# Patient Record
Sex: Female | Born: 1965 | Race: White | Hispanic: No | Marital: Married | Smoking: Never smoker
Health system: Southern US, Community
[De-identification: ages and names within clinical notes are randomized; demographics above are authoritative.]

## PROBLEM LIST (undated history)

## (undated) DIAGNOSIS — N750 Cyst of Bartholin's gland: Secondary | ICD-10-CM

## (undated) DIAGNOSIS — I10 Essential (primary) hypertension: Secondary | ICD-10-CM

## (undated) DIAGNOSIS — K5792 Diverticulitis of intestine, part unspecified, without perforation or abscess without bleeding: Secondary | ICD-10-CM

## (undated) DIAGNOSIS — N809 Endometriosis, unspecified: Secondary | ICD-10-CM

## (undated) HISTORY — PX: APPENDECTOMY: SHX54

## (undated) HISTORY — PX: LITHOTRIPSY: SUR834

## (undated) HISTORY — PX: FOOT SURGERY: SHX648

## (undated) HISTORY — PX: ABDOMINAL HYSTERECTOMY: SHX81

---

## 2008-01-14 ENCOUNTER — Encounter
Admission: RE | Admit: 2008-01-14 | Discharge: 2008-04-07 | Payer: Self-pay | Admitting: Physical Medicine & Rehabilitation

## 2008-01-16 ENCOUNTER — Ambulatory Visit: Payer: Self-pay | Admitting: Physical Medicine & Rehabilitation

## 2008-02-09 ENCOUNTER — Ambulatory Visit: Payer: Self-pay | Admitting: Physical Medicine & Rehabilitation

## 2008-03-11 ENCOUNTER — Ambulatory Visit: Payer: Self-pay | Admitting: Physical Medicine & Rehabilitation

## 2008-04-07 ENCOUNTER — Encounter
Admission: RE | Admit: 2008-04-07 | Discharge: 2008-05-03 | Payer: Self-pay | Admitting: Physical Medicine & Rehabilitation

## 2008-04-08 ENCOUNTER — Ambulatory Visit: Payer: Self-pay | Admitting: Physical Medicine & Rehabilitation

## 2008-05-03 ENCOUNTER — Ambulatory Visit: Payer: Self-pay | Admitting: Physical Medicine & Rehabilitation

## 2010-12-12 NOTE — Procedures (Signed)
[name], [name]          ACCOUNT NO.:  [id number]   MEDICAL RECORD NO.:  [id number]          PATIENT TYPE:  REC   LOCATION:  [facility]                          FACILITY:  [facility]   PHYSICIAN:  Erick Colace, M.D.DATE OF BIRTH:  [birth year]   DATE OF PROCEDURE:  04/08/2008  DATE OF DISCHARGE:                               OPERATIVE REPORT   PROCEDURE:  Right L5 dorsal ramus radiofrequency neurotomy, right L4  medial branch radiofrequency neurotomy, right L3 medial branch  radiofrequency neurotomy under sacral and lumbar fluoroscopic imaging.   INDICATION:  Lumbosacral spondylosis without myelopathy which had been  temporarily relieved by medial branch blocks at corresponding levels.  She has had 80% relief of pain on one occasion and 100% on another  occasion.   Pain interferes with mobility including bending and standing and only  partial response to medication management.  She has tried other  conservative care and she elects to proceed with the procedure.   Informed consent was obtained after describing risks and benefits of the  procedure with the patient.  These include bleeding, bruising,  infection, temporary or permanent paralysis.  She elects to proceed and  has given written consent.  The patient placed prone on the fluoroscopy  table. Betadine prep, sterilely draped. A 25-gauge 1-1/2-inch needle was  used to anesthetize the skin and subcu tissue, 1% lidocaine x2 cc, then  a 20-gauge 10-cm RF needle with 10 mm curved active tip was inserted  under fluoroscopic guidance first starting in the right S1, SAP, and  sacroiliac junction.  Bone contact made, confirmed with lateral imaging.  Sensory stem at 50 Hz followed by motor stem at 2 Hz confirmed proper  needle location followed by injection of 1 mL of solution containing 1  mL of 4 mg/mL dexamethasone and 2 mL of 1% MPF lidocaine followed by  radiofrequency lesioning at 70 degrees Celsius for 70 seconds.  In the  right L5, SAP transverse process junction was targeted.  Bone contact  made and confirmed with lateral imaging.  Sensory stem at 50 Hz followed  by motor stem at 2 Hz confirmed proper needle location followed by  injection of 1 mL of dexamethasone-lidocaine solution and radiofrequency  lesioning at 70 degrees Celsius for 70 seconds.  In the right L4, SAP  transverse process junction was targeted.  Bone contact made and  confirmed with lateral imaging.  Sensory stem at 50 Hz followed by motor  stem at 2 Hz confirmed proper needle location followed by injection of 1  mL of dexamethasone-lidocaine solution and radiofrequency lesioning at  70 degrees Celsius for 70 seconds.  The patient tolerated the procedure  well.  Pre and post injection vitals were stable.  Post injection  instructions given.      Erick Colace, M.D.  Electronically Signed     AEK/MEDQ  D:  04/08/2008 09:58:34  T:  04/08/2008 23:02:54  Job:  [id number]

## 2010-12-12 NOTE — Assessment & Plan Note (Signed)
CHIEF COMPLAINT:  Mid back pain.   The patient is here for radiofrequency neurotomy; however, she has no  new complaint that she would like to me to address today as well.  She  has pain in between the shoulder blades.  Has not had any trauma.  No  work injury.  She thinks that the type of work she is doing makes her a  little bit more prone to this, standing in one position for a long  period of time.  She has no lower extremity or upper extremity weakness  or numbness.  Her neck is sore, but this does not seem to be shooting  pain into the interscapular region.   REVIEW OF SYSTEMS:  Positive for depression, anxiety, and abdominal  pain.  She has had no fevers, chills, weight loss, or other signs of  infection.   SOCIAL HISTORY:  Lives with her fiance.  Continues to work 40 hours a  week.   FAMILY HISTORY:  Diabetes, high blood pressure, and alcohol abuse.   CURRENT MEDICATIONS:  Tramadol, went down to 120 tablets last month down  from 180.  She actually ran out a bit early.  I feel that she needs to  take at least what she used to.   PHYSICAL EXAMINATION:  GENERAL:  She has tenderness to palpation in her  bilateral thoracic paraspinals.  She has no weakness in the upper  extremities.  She has good neck range of motion; however, she does have  some pain with extension.   Gait is normal.   IMPRESSION:  Thoracic interscapular pain, likely myofascial, postural.  She may have some cervical facet-radiating pain in that area as well.   PLAN:  1. I will go ahead and send her to physical therapy, 4 visits in      [place] at [facility], to work on posture, interscapular      strengthening, and scapular stabilization.  2. I will see her back in about a month to follow up on this.  3. We will go back up to 180 tablets of Tramadol per month.  We may      reduce it down to 150 next month should her symptomatology improve.      If it does not improve, consider cervical imaging  with possible      medial branch blocks.      Sheila [name], M.D.  Electronically Signed     AEK/MedQ  D:  05/03/2008 10:33:48  T:  05/03/2008 23:17:37  Job #:  [id number]   cc:   Sheila [name]  Fax: [phone]

## 2010-12-12 NOTE — Procedures (Signed)
NAME:  Sheila [name], Sheila [name]          ACCOUNT NO.:  [id number]   MEDICAL RECORD NO.:  [id number]          PATIENT TYPE:  REC   LOCATION:  [facility]                          FACILITY:  [facility]   PHYSICIAN:  Erick Colace, M.D.DATE OF BIRTH:  1966-07-12   DATE OF PROCEDURE:  DATE OF DISCHARGE:                               OPERATIVE REPORT   PROCEDURE:  Bilateral L5 dorsal ramus injection, bilateral L4 medial  branch block, and bilateral L3 medial branch block under fluoroscopic  guidance.   INDICATIONS:  Lumbar spondylosis without myelopathy.  She has axial pain  with activity that goes as high as 8/10.  Pain is only partially  responsive to medication management and other conservative care.  She  had good response to prior set of medial branch blocks with 100% relief  of pain.   Informed consent was obtained after describing the risks and benefits of  the procedure with the patient.  These include bleeding, bruising,  infection, temporary or permanent paralysis.  She elects to proceed and  has given a written consent.  The patient was placed prone on  fluoroscopy table.  Betadine prep, sterile draped.  A 25-gauge, 1-1/2-  inch was used to anesthetize the skin and subcu tissue, 1% lidocaine x2  mL, and a 22-gauge, 3-1/2-inch spinal needle was inserted under  fluoroscopic guidance targeting the left S1 SAP sacral ala junction,  bone contact made confirmed with lateral imaging.  Omnipaque 180 x 0.5  mL demonstrated no intravascular uptake, then 0.5 mL of solution  containing a 1 mL of 4 mg/mL dexamethasone and 2 mL of 2% MPF lidocaine  was injected.  Then, the left L5 SAP transverse process junction  targeted, bone contact made, confirmed with lateral imaging.  Omnipaque  180 x 0.5 mL demonstrated no intravascular uptake, then 0.5 mL of  dexamethasone-lidocaine solution was injected.  Then, the left L4 SAP  transverse process junction targeted, bone contact made, confirmed with  lateral  imaging.  Omnipaque 180 x 0.5 mL demonstrated no intravascular  uptake, then 0.5 mL of dexamethasone-lidocaine solution was injected.  The same procedure was repeated on the right side with same needle  injectate and technique.  The patient the tolerated procedure well.  Pre  and Post-injection and vitals stable.  Post-injection instructions were  given.  We will assess Pre and Post-pain scores and determine whether  she is a candidate for radiofrequency neurotomy of corresponding medial  branches and dorsal ramus.      Erick Colace, M.D.  Electronically Signed     AEK/MEDQ  D:  03/11/2008 10:50:31  T:  03/12/2008 00:23:30  Job:  [id number]   cc:   Domenica Fail  Fax: [phone]

## 2010-12-12 NOTE — Group Therapy Note (Signed)
REFERRING PHYSICIAN:  Dr. Domenica Fail.   HISTORY:  Sheila [name] is a 45 year old female who has had low back  and buttocks pain for over a year, which started after trying out a new  job, where she is currently working.  She has to lift pallets and other  objects.  She has generally had no evidence of sciatic-type symptoms,  although she had one episode that lasted about 24 hours, which occurred  about two months ago.  She states that her pain is about 8/10.  Described as sharp, burning, intermittent stabbing, constant, dull, and  aching.  She also has hand pain and numbness and tingling in the hands  when holding phone.  Her sleep is fair and some of this problem is  related to her hand numbness and tingling that wakes her up at night.  She can walk, but does not think she could walk very far.   REVIEW OF SYSTEMS:  Positive for bladder problems, which are not new,  numbness, tingling, spasms, depression, anxiety, abdominal pain, and  diarrhea.   PAST SURGICAL HISTORY:  1. Bunionectomy bilaterally.  2. Hysterectomy.  3. Laparoscopy for endometriosis.  4. Appendectomy.  5. Inguinal hernia repair.   SOCIAL HISTORY:  Divorced.  She is quite upset that her daughter lives  out of state with her ex-husband, but did not elaborate on that.  The  patient denies alcohol or drug abuse, now or in the past.   FAMILY HISTORY:  1. Diabetes.  2. High blood pressure.  3. Alcohol abuse.   PHYSICAL EXAMINATION:  VITAL SIGNS:  Blood pressure 125/63, pulse 82,  respiration 18, and O2 saturation 97% on room air.  GENERAL:  Moderately obese female in no acute distress.  Mood and affect  appropriate.  Orientation x3.  Gait is normal.  She is able to toe walk  and heel walk.  NECK:  Range of motion is full.  EXTREMITIES:  Her upper extremity strength, range of motion, and  sensation are normal.  Her lower extremity strength, sensation, and  range of motion are normal.  She has normal joint  stability and no joint  tenderness, upper or lower extremities.  Her lumbar spine range of  motion is 25% in forward flexion and 25% in extension.  She has some  pain inhibitory-type behavior.  Extremities without edema.  She has good  pedal and radial pulses.  NEUROLOGIC:  Deep tendon reflexes are normal in the bilateral biceps,  triceps, brachioradialis, knee, and ankles.  Coordination is normal.   IMPRESSION:  1. Review of radiology report, lumbar spine dated May 27, 2008,      showed some mild bilateral facet joint degenerative changes at L4-      L5 and L5-S1.  Disk look okay.  2. BMET as well as CBCs are normal from last fall with the exception      of mildly elevated ALT, which maybe associated with her      acetaminophen usage.  She was on Percocet once a day back in      October, switched to hydrocodone, trialed on Stadol, failed in the      past on that plan as well.  She states that she is taking currently      about 4 or 5 a day of her hydrocodone.  We will check urine drug      screen on her, but indicated that she cannot get a prescription to      recheck the  results, which might be several days, she has three      hydrocodone left.  I have written a prescription for tramadol 50      q.i.d., which should reduce any withdrawal that she may have.      Also, given her some Naprosyn 500 b.i.d. over the next couple of      weeks to help with pain.  I do not think she should remain on NSAID      long-term, however.   I will see her back for lumbar medial branch block, as I do think this  is likely to be her main pain generator and we will follow this up with  some physical therapy directing exercises to coincide with pain  generator pathology.   Thank you for this interesting consultation.  I will keep you apprised  of her progress.      Sheila [name], M.D.  Electronically Signed     AEK/MedQ  D:  01/16/2008 16:31:59  T:  01/17/2008 04:49:37  Job #:   [id number]   cc:   Domenica Fail  Fax: [phone]

## 2010-12-12 NOTE — Procedures (Signed)
NAME:  Sheila [name], Sheila [name]          ACCOUNT NO.:  [id number]   MEDICAL RECORD NO.:  [id number]         PATIENT TYPE:  [facility]   LOCATION:                                 FACILITY:   PHYSICIAN:  Erick Colace, M.D.DATE OF BIRTH:  [birth year]   DATE OF PROCEDURE:  DATE OF DISCHARGE:                               OPERATIVE REPORT   PROCEDURE:  Left L5 dorsal ramus radiofrequency neurotomy, left L4  medial branch radiofrequency neurotomy, left L3 medial branch  radiofrequency neurotomy under lumbar and sacral fluoroscopic guidance.   INDICATIONS:  Lumbar spondylosis without myelopathy with improvement in  right-sided lumbar pain after right-sided RF 1 month ago.  Pain is only  partially response to medication management and physical therapy  management.   Pain is averaging 7/10 and interferes with walking and bending, as well  as her work activities.   Informed consent was obtained after describing risks and benefits of the  procedure with the patient.  These include bleeding, bruising,  infection, temporary permanent paralysis.  She elects to proceed and has  given written consent.  The patient placed prone on fluoroscopy table.  Betadine prep, sterile drape 25-gauge and 1-1/2 inch needle was used to  anesthetize the skin and subcu tissue 1% lidocaine x2 mL and a 22-gauge  3-1/2 inch spinal needle was inserted under fluoroscopic guidance  targeting the left S1 SAP sacral ala junction, bone contact made  confirmed with lateral imaging.  Sensory stem at 50 Hz followed by motor  stem at 2 Hz confirmed proper needle location followed by injection of 1  mL of solution containing 1 mL of 4 mL dexamethasone, 2 mL of 2% MPF  lidocaine followed by radiofrequency lesioning 70 degrees Celsius for 70  seconds.  Then, the left L5 SAP transverse junction targeted bone  contact made confirmed with lateral imaging.  Sensory stem at 50 Hz  followed by motor stem at 2 Hz confirmed proper needle  location followed  by injection of 1 mL of dexamethasone lidocaine solution and  radiofrequency lesioning 70 degrees Celsius for 70 seconds, and left L4  SAP transverse junction targeted bone contact made confirmed with  lateral imaging.  Sensory stem at 50 Hz followed by motor stem at 2 Hz  confirmed proper needle location followed by injection of 1 mL of  dexamethasone lidocaine solution and radiofrequency lesioning 70 degrees  Celsius for 70 seconds.  The patient tolerated procedure well.  Pre and  post injection vitals stable.  Post injection instructions given.  We  will go ahead and see her back in 1 month.      Erick Colace, M.D.  Electronically Signed     AEK/MEDQ  D:  05/03/2008 10:30:53  T:  05/03/2008 22:53:13  Job:  [id number]   cc:   Domenica Fail  Fax: [phone]

## 2010-12-12 NOTE — Procedures (Signed)
Sheila [name], Sheila [name]          ACCOUNT NO.:  [id number]   MEDICAL RECORD NO.:  [id number]          PATIENT TYPE:  REC   LOCATION:  [facility]                          FACILITY:  [facility]   PHYSICIAN:  Erick Colace, M.D.DATE OF BIRTH:  [birth year]   DATE OF PROCEDURE:  01/30/2008  DATE OF DISCHARGE:                               OPERATIVE REPORT   PROCEDURE:  Bilateral L5 dorsal ramus injection, bilateral L4 medial  branch block, and bilateral L3 medial branch block under fluoroscopic  guidance.   INDICATION:  Lumbar spondylosis without myelopathy.  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   PROCEDURE:  Informed consent was obtained after describing risks and  benefits of the procedure with the patient.  These include bleeding,  bruising, and infection.  She elects to proceed and has given written  consent.  The patient was placed prone on fluoroscopy table.  Betadine  prep, sterile drape.  A 25-gauge 1-1/2 inch needle was used to  anesthetize the skin and subcutaneous tissue, 1% lidocaine x3 mL at each  site.  Then a 22-gauge 5-inch spinal needle was inserted, first starting  at the left S1, SAP, and sacroiliac junction.  Bone contact was made and  confirmed with lateral imaging.  Omnipaque 180 x 0.5 mL demonstrated no  intravascular uptake, then 0.5 mL of a solution containing 1 mL of 4  mg/mL dexamethasone and 2 mL of 2% MPF lidocaine were injected in the  left L5, SAP, and transverse process junction targeted.  Bone contact  was made and confirmed with lateral imaging.  Omnipaque 180 x 0.5 mL  demonstrated no intravascular uptake and 0.5 mL of dexamethasone-  lidocaine solution was injected in the left L4, SAP, and transverse  process junction targeted.  Bone contact was made and confirmed with  lateral imaging.  Omnipaque 180 x 0.5 mL demonstrated no intravascular  uptake, and 0.5 mL of  dexamethasone-lidocaine solution was injected.  The same procedure was performed on the right side at corresponding  levels using same needle, injectate, and injection technique.  The  patient tolerated the procedure well.  Pre and post-injection vitals  stable with 100% relief of pain, pre-to-post.  Repeat in 1 month for  confirmatory and consider for medial branch radiofrequency.      Erick Colace, M.D.  Electronically Signed     AEK/MEDQ  D:  02/09/2008 12:19:17  T:  02/09/2008 22:57:06  Job:  [id number]   cc:   Domenica Fail  Fax: [phone]

## 2011-01-09 ENCOUNTER — Inpatient Hospital Stay (HOSPITAL_COMMUNITY): Admission: RE | Admit: 2011-01-09 | Payer: Medicaid Other | Source: Ambulatory Visit

## 2011-01-09 ENCOUNTER — Emergency Department (HOSPITAL_COMMUNITY)
Admission: EM | Admit: 2011-01-09 | Discharge: 2011-01-09 | Disposition: A | Discharge status: Home / Self Care | Payer: Medicaid Other | Attending: Emergency Medicine | Admitting: Emergency Medicine

## 2011-01-09 DIAGNOSIS — R10813 Right lower quadrant abdominal tenderness: Secondary | ICD-10-CM | POA: Insufficient documentation

## 2011-01-09 DIAGNOSIS — N898 Other specified noninflammatory disorders of vagina: Secondary | ICD-10-CM | POA: Insufficient documentation

## 2011-01-09 DIAGNOSIS — R630 Anorexia: Secondary | ICD-10-CM | POA: Insufficient documentation

## 2011-01-09 DIAGNOSIS — R3915 Urgency of urination: Secondary | ICD-10-CM | POA: Insufficient documentation

## 2011-01-09 DIAGNOSIS — R109 Unspecified abdominal pain: Secondary | ICD-10-CM | POA: Insufficient documentation

## 2011-01-09 DIAGNOSIS — Z79899 Other long term (current) drug therapy: Secondary | ICD-10-CM | POA: Insufficient documentation

## 2011-01-09 DIAGNOSIS — Z9889 Other specified postprocedural states: Secondary | ICD-10-CM | POA: Insufficient documentation

## 2011-01-09 DIAGNOSIS — E669 Obesity, unspecified: Secondary | ICD-10-CM | POA: Insufficient documentation

## 2011-01-09 DIAGNOSIS — R300 Dysuria: Secondary | ICD-10-CM | POA: Insufficient documentation

## 2011-01-09 LAB — CBC
HCT: 41.8 % (ref 36.0–46.0)
MCHC: 34.4 g/dL (ref 30.0–36.0)
RDW: 13.1 % (ref 11.5–15.5)

## 2011-01-09 LAB — DIFFERENTIAL
Basophils Absolute: 0 10*3/uL (ref 0.0–0.1)
Basophils Relative: 0 % (ref 0–1)
Eosinophils Relative: 2 % (ref 0–5)
Monocytes Absolute: 0.7 10*3/uL (ref 0.1–1.0)

## 2011-01-09 LAB — URINALYSIS, ROUTINE W REFLEX MICROSCOPIC
Glucose, UA: NEGATIVE mg/dL
Ketones, ur: NEGATIVE mg/dL
Leukocytes, UA: NEGATIVE
Protein, ur: NEGATIVE mg/dL

## 2011-01-09 LAB — POCT I-STAT, CHEM 8
Calcium, Ion: 1.01 mmol/L — ABNORMAL LOW (ref 1.12–1.32)
Creatinine, Ser: 1.2 mg/dL (ref 0.4–1.2)
Glucose, Bld: 100 mg/dL — ABNORMAL HIGH (ref 70–99)
Hemoglobin: 14.6 g/dL (ref 12.0–15.0)
TCO2: 13 mmol/L (ref 0–100)

## 2011-01-09 LAB — WET PREP, GENITAL

## 2011-01-10 LAB — GC/CHLAMYDIA PROBE AMP, GENITAL
Chlamydia, DNA Probe: NEGATIVE
GC Probe Amp, Genital: NEGATIVE

## 2012-11-21 ENCOUNTER — Emergency Department (HOSPITAL_COMMUNITY)
Admission: EM | Admit: 2012-11-21 | Discharge: 2012-11-21 | Disposition: A | Discharge status: Home / Self Care | Payer: Medicaid Other | Attending: Emergency Medicine | Admitting: Emergency Medicine

## 2012-11-21 ENCOUNTER — Encounter (HOSPITAL_COMMUNITY): Payer: Self-pay | Admitting: Emergency Medicine

## 2012-11-21 DIAGNOSIS — Z90710 Acquired absence of both cervix and uterus: Secondary | ICD-10-CM | POA: Insufficient documentation

## 2012-11-21 DIAGNOSIS — R109 Unspecified abdominal pain: Secondary | ICD-10-CM | POA: Insufficient documentation

## 2012-11-21 DIAGNOSIS — Z79899 Other long term (current) drug therapy: Secondary | ICD-10-CM | POA: Insufficient documentation

## 2012-11-21 LAB — COMPREHENSIVE METABOLIC PANEL
ALT: 14 U/L (ref 0–35)
AST: 17 U/L (ref 0–37)
Alkaline Phosphatase: 54 U/L (ref 39–117)
CO2: 29 mEq/L (ref 19–32)
Chloride: 103 mEq/L (ref 96–112)
GFR calc Af Amer: >90 mL/min (ref >90)
GFR calc non Af Amer: >90 mL/min (ref >90)
Glucose, Bld: 96 mg/dL (ref 70–99)
Potassium: 3.9 mEq/L (ref 3.5–5.1)
Sodium: 141 mEq/L (ref 135–145)

## 2012-11-21 LAB — CBC WITH DIFFERENTIAL/PLATELET
Lymphocytes Relative: 41 % (ref 12–46)
Lymphs Abs: 3.4 10*3/uL (ref 0.7–4.0)
MCV: 88.5 fL (ref 78.0–100.0)
Neutro Abs: 4 10*3/uL (ref 1.7–7.7)
Neutrophils Relative %: 49 % (ref 43–77)
Platelets: 346 10*3/uL (ref 150–400)
RBC: 4.69 MIL/uL (ref 3.87–5.11)
WBC: 8.3 10*3/uL (ref 4.0–10.5)

## 2012-11-21 LAB — URINALYSIS, ROUTINE W REFLEX MICROSCOPIC
Bilirubin Urine: NEGATIVE
Glucose, UA: NEGATIVE mg/dL
Hgb urine dipstick: NEGATIVE
Protein, ur: NEGATIVE mg/dL
Urobilinogen, UA: 1 mg/dL (ref 0.0–1.0)

## 2012-11-21 MED ORDER — KETOROLAC TROMETHAMINE 60 MG/2ML IM SOLN
60.0000 mg | Freq: Once | INTRAMUSCULAR | Status: AC
  Administered 2012-11-21: 60 mg via INTRAMUSCULAR
  Filled 2012-11-21: qty 2

## 2012-11-21 MED ORDER — HYDROCODONE-ACETAMINOPHEN 5-325 MG PO TABS: 2.0000 | ORAL_TABLET | ORAL | Status: DC | PRN

## 2012-11-21 MED ORDER — HYDROMORPHONE HCL PF 1 MG/ML IJ SOLN
1.0000 mg | Freq: Once | INTRAMUSCULAR | Status: AC
  Administered 2012-11-21: 1 mg via INTRAMUSCULAR
  Filled 2012-11-21: qty 1

## 2012-11-21 NOTE — ED Provider Notes (Signed)
History     CSN: [id number]  Arrival date & time 11/21/12  2034   First MD Initiated Contact with Patient 11/21/12 2219      Chief Complaint  Patient presents with  . Abdominal Pain    (Consider location/radiation/quality/duration/timing/severity/associated sxs/prior treatment) Patient is a 47 y.o. female presenting with abdominal pain. The history is provided by the patient.  Abdominal Pain  patient here with severe right-sided pain that has been there for 2 months. Had a CT scan week ago which showed a cyst in her right lower quadrant. She does have a history of total abdominal hysterectomy. No fever or chills. No vomiting or diarrhea. Denies any dysuria or hematuria. Pain is constant and worse with moving. Has used Vicodin before in the past but she has run out. Denies any rashes.  History reviewed. No pertinent past medical history.  Past Surgical History  Procedure Laterality Date  . Abdominal hysterectomy    . Appendectomy      No family history on file.  History  Substance Use Topics  . Smoking status: Never Smoker   . Smokeless tobacco: Not on file  . Alcohol Use: No    OB History   Grav Para Term Preterm Abortions TAB SAB Ect Mult Living                  Review of Systems  Gastrointestinal: Positive for abdominal pain.  All other systems reviewed and are negative.    Allergies  Review of patient's allergies indicates no known allergies.  Home Medications   Current Outpatient Rx  Name  Route  Sig  Dispense  Refill  . estradiol (ESTRACE) 1 MG tablet   Oral   Take 1 mg by mouth daily.         Marland Kitchen FLUoxetine (PROZAC) 20 MG capsule   Oral   Take 20 mg by mouth daily.         Marland Kitchen gabapentin (NEURONTIN) 600 MG tablet   Oral   Take 600 mg by mouth 3 (three) times daily.         Marland Kitchen HYDROcodone-acetaminophen (NORCO/VICODIN) 5-325 MG per tablet   Oral   Take 1 tablet by mouth every 6 (six) hours as needed for pain (pain).           BP 137/94   Pulse 77  Temp(Src) 98.4 F (36.9 C) (Oral)  Resp 16  SpO2 99%  Physical Exam  Nursing note and vitals reviewed. Constitutional: She is oriented to person, place, and time. She appears well-developed and well-nourished.  Non-toxic appearance. No distress.  HENT:  Head: Normocephalic and atraumatic.  Eyes: Conjunctivae, EOM and lids are normal. Pupils are equal, round, and reactive to light.  Neck: Normal range of motion. Neck supple. No tracheal deviation present. No mass present.  Cardiovascular: Normal rate, regular rhythm and normal heart sounds.  Exam reveals no gallop.   No murmur heard. Pulmonary/Chest: Effort normal and breath sounds normal. No stridor. No respiratory distress. She has no decreased breath sounds. She has no wheezes. She has no rhonchi. She has no rales.  Abdominal: Soft. Normal appearance and bowel sounds are normal. She exhibits no distension. There is tenderness in the suprapubic area. There is no rigidity, no rebound, no guarding and no CVA tenderness.  Musculoskeletal: Normal range of motion. She exhibits no edema and no tenderness.  Neurological: She is alert and oriented to person, place, and time. She has normal strength. No cranial nerve deficit or sensory  deficit. GCS eye subscore is 4. GCS verbal subscore is 5. GCS motor subscore is 6.  Skin: Skin is warm and dry. No abrasion and no rash noted.  Psychiatric: She has a normal mood and affect. Her speech is normal and behavior is normal.    ED Course  Procedures (including critical care time)  Labs Reviewed  CBC WITH DIFFERENTIAL  COMPREHENSIVE METABOLIC PANEL  LIPASE, BLOOD  URINALYSIS, ROUTINE W REFLEX MICROSCOPIC   No results found.   No diagnosis found.    MDM  Pt given pain meds and feels better        Toy Baker, MD 11/21/12 2324

## 2012-11-21 NOTE — ED Notes (Signed)
Pt states she is having severe pain from right side of abdomen not able to receive relief from pain, with pain radiating to her back. Pt states she was told she had a massive cyst while having a pep smear. Pt has had a hysterectomy.

## 2013-03-16 ENCOUNTER — Emergency Department (HOSPITAL_COMMUNITY)
Admission: EM | Admit: 2013-03-16 | Discharge: 2013-03-17 | Disposition: A | Discharge status: Home / Self Care | Payer: Medicaid Other | Source: Home / Self Care | Attending: Emergency Medicine | Admitting: Emergency Medicine

## 2013-03-16 ENCOUNTER — Encounter (HOSPITAL_COMMUNITY): Payer: Self-pay | Admitting: Emergency Medicine

## 2013-03-16 ENCOUNTER — Emergency Department (HOSPITAL_COMMUNITY): Payer: Medicaid Other

## 2013-03-16 ENCOUNTER — Emergency Department (HOSPITAL_COMMUNITY)
Admission: EM | Admit: 2013-03-16 | Discharge: 2013-03-16 | Disposition: A | Discharge status: Home / Self Care | Payer: Medicaid Other | Attending: Emergency Medicine | Admitting: Emergency Medicine

## 2013-03-16 DIAGNOSIS — R197 Diarrhea, unspecified: Secondary | ICD-10-CM | POA: Insufficient documentation

## 2013-03-16 DIAGNOSIS — N949 Unspecified condition associated with female genital organs and menstrual cycle: Secondary | ICD-10-CM | POA: Insufficient documentation

## 2013-03-16 DIAGNOSIS — R109 Unspecified abdominal pain: Secondary | ICD-10-CM

## 2013-03-16 DIAGNOSIS — R112 Nausea with vomiting, unspecified: Secondary | ICD-10-CM | POA: Insufficient documentation

## 2013-03-16 DIAGNOSIS — Z8719 Personal history of other diseases of the digestive system: Secondary | ICD-10-CM | POA: Insufficient documentation

## 2013-03-16 DIAGNOSIS — Z90710 Acquired absence of both cervix and uterus: Secondary | ICD-10-CM | POA: Insufficient documentation

## 2013-03-16 DIAGNOSIS — N898 Other specified noninflammatory disorders of vagina: Secondary | ICD-10-CM | POA: Insufficient documentation

## 2013-03-16 DIAGNOSIS — Z79899 Other long term (current) drug therapy: Secondary | ICD-10-CM | POA: Insufficient documentation

## 2013-03-16 DIAGNOSIS — Z9889 Other specified postprocedural states: Secondary | ICD-10-CM | POA: Insufficient documentation

## 2013-03-16 DIAGNOSIS — R609 Edema, unspecified: Secondary | ICD-10-CM

## 2013-03-16 DIAGNOSIS — N750 Cyst of Bartholin's gland: Secondary | ICD-10-CM | POA: Insufficient documentation

## 2013-03-16 DIAGNOSIS — R102 Pelvic and perineal pain: Secondary | ICD-10-CM

## 2013-03-16 DIAGNOSIS — R6883 Chills (without fever): Secondary | ICD-10-CM | POA: Insufficient documentation

## 2013-03-16 DIAGNOSIS — K5900 Constipation, unspecified: Secondary | ICD-10-CM | POA: Insufficient documentation

## 2013-03-16 HISTORY — DX: Cyst of Bartholin's gland: N75.0

## 2013-03-16 HISTORY — DX: Diverticulitis of intestine, part unspecified, without perforation or abscess without bleeding: K57.92

## 2013-03-16 LAB — CBC WITH DIFFERENTIAL/PLATELET
Basophils Absolute: 0 10*3/uL (ref 0.0–0.1)
Eosinophils Relative: 1 % (ref 0–5)
HCT: 42.4 % (ref 36.0–46.0)
Hemoglobin: 14.2 g/dL (ref 12.0–15.0)
Lymphocytes Relative: 33 % (ref 12–46)
Lymphs Abs: 3.4 10*3/uL (ref 0.7–4.0)
MCV: 88 fL (ref 78.0–100.0)
Monocytes Absolute: 1.1 10*3/uL — ABNORMAL HIGH (ref 0.1–1.0)
Monocytes Relative: 10 % (ref 3–12)
Neutro Abs: 5.8 10*3/uL (ref 1.7–7.7)
RDW: 13 % (ref 11.5–15.5)
WBC: 10.3 10*3/uL (ref 4.0–10.5)

## 2013-03-16 LAB — WET PREP, GENITAL
Trich, Wet Prep: NONE SEEN
Yeast Wet Prep HPF POC: NONE SEEN

## 2013-03-16 LAB — URINE MICROSCOPIC-ADD ON

## 2013-03-16 LAB — COMPREHENSIVE METABOLIC PANEL
BUN: 12 mg/dL (ref 6–23)
CO2: 24 mEq/L (ref 19–32)
Calcium: 9 mg/dL (ref 8.4–10.5)
Chloride: 99 mEq/L (ref 96–112)
Creatinine, Ser: 0.62 mg/dL (ref 0.50–1.10)
GFR calc Af Amer: >90 mL/min (ref >90)
GFR calc non Af Amer: >90 mL/min (ref >90)
Glucose, Bld: 96 mg/dL (ref 70–99)
Total Bilirubin: 0.4 mg/dL (ref 0.3–1.2)

## 2013-03-16 LAB — OCCULT BLOOD, POC DEVICE: Fecal Occult Bld: POSITIVE — AB

## 2013-03-16 LAB — URINALYSIS, ROUTINE W REFLEX MICROSCOPIC
Glucose, UA: NEGATIVE mg/dL
Ketones, ur: NEGATIVE mg/dL
Protein, ur: 30 mg/dL — AB
pH: 6 (ref 5.0–8.0)

## 2013-03-16 MED ORDER — HYDROMORPHONE HCL PF 1 MG/ML IJ SOLN
1.0000 mg | Freq: Once | INTRAMUSCULAR | Status: AC
  Administered 2013-03-16: 1 mg via INTRAVENOUS
  Filled 2013-03-16: qty 1

## 2013-03-16 MED ORDER — SUCRALFATE 1 G PO TABS: 1.0000 g | ORAL_TABLET | Freq: Four times a day (QID) | ORAL | Status: AC

## 2013-03-16 MED ORDER — HYDROCODONE-ACETAMINOPHEN 5-325 MG PO TABS
2.0000 | ORAL_TABLET | Freq: Once | ORAL | Status: AC
  Administered 2013-03-16: 2 via ORAL
  Filled 2013-03-16: qty 2

## 2013-03-16 MED ORDER — PROMETHAZINE HCL 25 MG PO TABS: 25.0000 mg | ORAL_TABLET | Freq: Four times a day (QID) | ORAL | Status: DC | PRN

## 2013-03-16 MED ORDER — HYDROMORPHONE HCL PF 1 MG/ML IJ SOLN
1.0000 mg | Freq: Once | INTRAMUSCULAR | Status: AC
  Administered 2013-03-16: 1 mg via INTRAMUSCULAR
  Filled 2013-03-16: qty 1

## 2013-03-16 MED ORDER — FAMOTIDINE 20 MG PO TABS: 20.0000 mg | ORAL_TABLET | Freq: Two times a day (BID) | ORAL | Status: DC

## 2013-03-16 MED ORDER — IOHEXOL 300 MG/ML  SOLN
50.0000 mL | Freq: Once | INTRAMUSCULAR | Status: AC | PRN
  Administered 2013-03-16: 50 mL via ORAL

## 2013-03-16 MED ORDER — IOHEXOL 300 MG/ML  SOLN
100.0000 mL | Freq: Once | INTRAMUSCULAR | Status: AC | PRN
  Administered 2013-03-16: 100 mL via INTRAVENOUS

## 2013-03-16 MED ORDER — HYDROCODONE-ACETAMINOPHEN 5-325 MG PO TABS: 1.0000 | ORAL_TABLET | ORAL | Status: DC | PRN

## 2013-03-16 NOTE — ED Notes (Signed)
Pelvic complete, pending CT.

## 2013-03-16 NOTE — ED Notes (Signed)
Pain med given, pt to CT, family leaving to go out to car in parking lot (minor, 47 y/o left in car), encouraged family to bring minor in or stay with her.

## 2013-03-16 NOTE — ED Notes (Signed)
Patient complaining of nausea.  MD notified. 

## 2013-03-16 NOTE — ED Provider Notes (Signed)
Medical screening examination/treatment/procedure(s) were performed by non-physician practitioner and as supervising physician I was immediately available for consultation/collaboration.  Elliott Quade M Allanna Bresee, MD 03/16/13 0620 

## 2013-03-16 NOTE — ED Provider Notes (Signed)
CSN: [id number]     Arrival date & time 03/16/13  0013 History     First MD Initiated Contact with Patient 03/16/13 0303     Chief Complaint  Patient presents with  . Hematochezia   HPI  History provided by the patient. Patient is a 47 year old female with history of endometriosis, complete abdominal hysterectomy, appendectomy and diverticulitis who presents with complaints of continued abdominal pains, pelvic pains and vaginal pains. Symptoms have been present for the past month or more. She states she initially was having some lower pelvic and vaginal pain with intercourse and was evaluated by her GYN. She eventually had an outpatient MRI of her abdomen and pelvis and was told she had diverticulitis. She was treated for a week with antibiotics but reports never having any improvement of symptoms. She was recently scheduled to follow up with the GI specialist and her appointment is later this month. Since that time she continues to have abdominal pains discomforts that are worsened with eating and drinking. Eating and drinking also causes episodes of vomiting. She has not been able to keep much down. She has attempted to use Phenergan at home for nausea vomiting as well as hydrocodone 10 mg without any significant relief. She has also been using Pepto-Bismol and recently noticed dark black type stools. She has not had any other bleeding. Patient has also been evaluated by her primary care provider who did not determine the cause of her dyspareunia or other abdominal symptoms. She has not had any associated fever, chills or sweats.    Past Medical History  Diagnosis Date  . Diverticulitis    Past Surgical History  Procedure Laterality Date  . Abdominal hysterectomy    . Appendectomy     No family history on file. History  Substance Use Topics  . Smoking status: Never Smoker   . Smokeless tobacco: Not on file  . Alcohol Use: No   OB History   Grav Para Term Preterm Abortions TAB SAB  Ect Mult Living                 Review of Systems  Constitutional: Negative for fever, chills, diaphoresis and fatigue.  Respiratory: Negative for shortness of breath.   Cardiovascular: Negative for chest pain.  Gastrointestinal: Positive for nausea, vomiting, abdominal pain and diarrhea. Negative for constipation and blood in stool.  Genitourinary: Positive for vaginal pain. Negative for dysuria, frequency, hematuria, flank pain, vaginal bleeding and vaginal discharge.  All other systems reviewed and are negative.    Allergies  Review of patient's allergies indicates no known allergies.  Home Medications   Current Outpatient Rx  Name  Route  Sig  Dispense  Refill  . estradiol (ESTRACE) 1 MG tablet   Oral   Take 1 mg by mouth daily.         Marland Kitchen FLUoxetine (PROZAC) 20 MG capsule   Oral   Take 20 mg by mouth daily.         Marland Kitchen gabapentin (NEURONTIN) 600 MG tablet   Oral   Take 600 mg by mouth 3 (three) times daily.         Marland Kitchen HYDROcodone-acetaminophen (NORCO) 10-325 MG per tablet   Oral   Take 1 tablet by mouth every 6 (six) hours as needed for pain.         . pantoprazole (PROTONIX) 40 MG tablet   Oral   Take 40 mg by mouth daily.         Marland Kitchen  promethazine (PHENERGAN) 25 MG tablet   Oral   Take 25 mg by mouth every 6 (six) hours as needed for nausea.          BP 125/83  Pulse 83  Temp(Src) 98.3 F (36.8 C) (Oral)  Resp 18  SpO2 99% Physical Exam  Nursing note and vitals reviewed. Constitutional: She is oriented to person, place, and time. She appears well-developed and well-nourished. No distress.  HENT:  Head: Normocephalic.  Cardiovascular: Normal rate and regular rhythm.   Pulmonary/Chest: Effort normal and breath sounds normal. No respiratory distress. She has no wheezes. She has no rales.  Abdominal: Soft. She exhibits no distension. There is tenderness. There is no rebound and no guarding.  Diffuse abdominal tenderness seems greatest in the left  upper quadrant and bilateral lower abdominal and pelvic area. No rebounding or guarding.  Genitourinary:  Chaperone was present. There is small amount of clear to white vaginal discharge. No significant dryness. Vaginal walls unremarkable without any lacerations. No bleeding. Patient does have a tender somewhat fluctuant firmness to the superior part of the vaginal wall. There is also very small slightly tender nodule to right-sided upper labia area.  Musculoskeletal: Normal range of motion.  Neurological: She is alert and oriented to person, place, and time.  Skin: Skin is warm and dry. No rash noted.  Psychiatric: She has a normal mood and affect. Her behavior is normal.    ED Course   Procedures   Results for orders placed during the hospital encounter of 03/16/13  URINALYSIS, ROUTINE W REFLEX MICROSCOPIC      Result Value Range   Color, Urine YELLOW  YELLOW   APPearance CLOUDY (*) CLEAR   Specific Gravity, Urine 1.035 (*) 1.005 - 1.030   pH 6.0  5.0 - 8.0   Glucose, UA NEGATIVE  NEGATIVE mg/dL   Hgb urine dipstick MODERATE (*) NEGATIVE   Bilirubin Urine SMALL (*) NEGATIVE   Ketones, ur NEGATIVE  NEGATIVE mg/dL   Protein, ur 30 (*) NEGATIVE mg/dL   Urobilinogen, UA 1.0  0.0 - 1.0 mg/dL   Nitrite NEGATIVE  NEGATIVE   Leukocytes, UA SMALL (*) NEGATIVE  CBC WITH DIFFERENTIAL      Result Value Range   WBC 10.3  4.0 - 10.5 K/uL   RBC 4.82  3.87 - 5.11 MIL/uL   Hemoglobin 14.2  12.0 - 15.0 g/dL   HCT 40.9  81.1 - 91.4 %   MCV 88.0  78.0 - 100.0 fL   MCH 29.5  26.0 - 34.0 pg   MCHC 33.5  30.0 - 36.0 g/dL   RDW 78.2  95.6 - 21.3 %   Platelets 342  150 - 400 K/uL   Neutrophils Relative % 56  43 - 77 %   Neutro Abs 5.8  1.7 - 7.7 K/uL   Lymphocytes Relative 33  12 - 46 %   Lymphs Abs 3.4  0.7 - 4.0 K/uL   Monocytes Relative 10  3 - 12 %   Monocytes Absolute 1.1 (*) 0.1 - 1.0 K/uL   Eosinophils Relative 1  0 - 5 %   Eosinophils Absolute 0.1  0.0 - 0.7 K/uL   Basophils Relative  0  0 - 1 %   Basophils Absolute 0.0  0.0 - 0.1 K/uL  COMPREHENSIVE METABOLIC PANEL      Result Value Range   Sodium 138  135 - 145 mEq/L   Potassium 3.6  3.5 - 5.1 mEq/L   Chloride 99  96 - 112 mEq/L   CO2 24  19 - 32 mEq/L   Glucose, Bld 96  70 - 99 mg/dL   BUN 12  6 - 23 mg/dL   Creatinine, Ser 1.61  0.50 - 1.10 mg/dL   Calcium 9.0  8.4 - 09.6 mg/dL   Total Protein 7.6  6.0 - 8.3 g/dL   Albumin 4.2  3.5 - 5.2 g/dL   AST 29  0 - 37 U/L   ALT 23  0 - 35 U/L   Alkaline Phosphatase 55  39 - 117 U/L   Total Bilirubin 0.4  0.3 - 1.2 mg/dL   GFR calc non Af Amer >90  >90 mL/min   GFR calc Af Amer >90  >90 mL/min  URINE MICROSCOPIC-ADD ON      Result Value Range   Squamous Epithelial / LPF MANY (*) RARE   WBC, UA 7-10  <3 WBC/hpf   RBC / HPF 3-6  <3 RBC/hpf   Bacteria, UA FEW (*) RARE   Crystals CA OXALATE CRYSTALS (*) NEGATIVE       Ct Abdomen Pelvis W Contrast  03/16/2013   *RADIOLOGY REPORT*  Clinical Data: Generalized abdominal pain.  Diagnosed with diverticulitis.  Pain worsening after eating.  Nausea and vomiting.  CT ABDOMEN AND PELVIS WITH CONTRAST  Technique:  Multidetector CT imaging of the abdomen and pelvis was performed following the standard protocol during bolus administration of intravenous contrast.  Contrast: OMNIPAQUE IOHEXOL 300 MG/ML  SOLN  Comparison: 03/16/2011  Findings: Atelectasis in the lung bases.  Diffuse fatty infiltration of the liver.  The spleen, gallbladder, pancreas, adrenal glands, abdominal aorta, inferior vena cava, retroperitoneal lymph nodes, and kidneys are unremarkable.  The stomach and small bowel are not abnormally distended and there is no wall thickening.  Stool filled colon without distension. Abdominal wall musculature appears intact.  No free air or free fluid in the abdomen.  Pelvis:  The uterus is surgically absent.  No abnormal adnexal masses.  Diverticula in the sigmoid colon without evidence of diverticulitis.  Appendix is  surgically absent. The enlarged lymph node in the left external iliac chain, stable since previous study. Cyst adjacent to the right side of the vaginal region likely represents a Bartholin gland or Skene's gland cyst.  This was present on previous MRI pelvis 12/23/2012. Normal alignment of the lumbar vertebrae.  No destructive bone lesions.  IMPRESSION: The no acute changes since previous study.  Again demonstrated is fatty infiltration of the liver.  Cystic structure adjacent to the right side of the vagina.  Mildly enlarged left iliac lymph node. No evidence of diverticulitis or other acute process.   Original Report Authenticated By: Burman Nieves, M.D.      1. Abdominal pain   2. Bartholin cyst     MDM  3:15 AM patient seen and evaluated. Patient laying in bed appears comfortable in no acute distress her significant discomfort.  Patient symptoms have been chronic in nature. They are somewhat acutely worsened. She does have planned followup at the GI specialists on 29th.  Patient had improvement after pain medications. No concerning findings on lab tests her CT scan. We'll have her continue followup with GI as planned.   Angus Seller, PA-C 03/16/13 [phone]

## 2013-03-16 NOTE — ED Notes (Signed)
Pt presents with generalized abdominal pain.  Pt was diagnosed with diverticulitis- has an appointment with a GI doctor on 8/29, pt has had increased abdominal pain.  States the pain is worse after she eats.  Pt also complaining of N/V this week.  States her bowel movements have been dark in color.  Pt also complaining of vaginal pain as well as pain with intercourse- admits to having moderate vaginal discharge for "several months".

## 2013-03-16 NOTE — ED Notes (Signed)
CT notified contrast finished. EDPA notified of "ready for pelvic exam".

## 2013-03-16 NOTE — ED Notes (Signed)
PT. REPORTS BLACK STOOLS THIS EVENING , VOMITTING AND DIARRHEA FOR SEVERAL WEEKS .

## 2013-03-16 NOTE — ED Notes (Signed)
PT. REPORTS PERSISTENT PAIN AT BARTHOLINS CYST , SEEN HERE THIS MORNING PRESCRIBED WITH HYDROCODONE WITH NO RELIEF.

## 2013-03-16 NOTE — ED Notes (Addendum)
Pelvic cart set up at [facility]. Pt alert, NAD, calm, interactive, skin W&D, resps e/u, speaking in clear complete sentences, mother at [facility]. Reports only mild pain, denies nausea or other sx. PO contrast finished.

## 2013-03-16 NOTE — ED Provider Notes (Signed)
CSN: [id number]     Arrival date & time 03/16/13  1921 History     First MD Initiated Contact with Patient 03/16/13 2017     Chief Complaint  Patient presents with  . Bartholin's Cyst   (Consider location/radiation/quality/duration/timing/severity/associated sxs/prior Treatment) HPI  53 old female with a history of diverticulitis and reported history of Bartholin's cyst with emergency department visit yesterday for abdominal pain presents with continued abdominal pain present for months, and increasing abdominal pain at her "Bartholin's cyst" site.  Patient reports last night she was diagnosed with a Bartholin cyst, it was recommended to seek OB/GYN followup, however was on the phone all day trying to set up an appointment into the closest appointment she got was for September 25. Her Versed the pain is severe and unbearable and that she would like the Bartholin's cyst drained now. Reports the pain is worse with palpation. Also reports pain with intercourse. There is some mild vaginal discharge, and right-sided abdominal pain. Patient had a CT of her abdomen with contrast ordered yesterday which was significant for a cyst to the right of her vaginal wall which was unchanged from prior MRI in May. Patient reports she's had months of pain around this area, her last weeks has been getting worse over the last couple days it has been unbearable. Denies any dysuria, blood in stool, vaginal bleeding.  Notes intermittent nausea and vomiting over months and alternating constipation and diarrhea. Is status post total hysterectomy and oophorectomy, and appendectomy.    Past Medical History  Diagnosis Date  . Diverticulitis   . Bartholin cyst    Past Surgical History  Procedure Laterality Date  . Abdominal hysterectomy    . Appendectomy     No family history on file. History  Substance Use Topics  . Smoking status: Never Smoker   . Smokeless tobacco: Not on file  . Alcohol Use: No   OB History    Grav Para Term Preterm Abortions TAB SAB Ect Mult Living                 Review of Systems  Constitutional: Positive for chills. Negative for fever.  HENT: Negative for sore throat and neck pain.   Eyes: Negative for visual disturbance.  Respiratory: Negative for cough and shortness of breath.   Cardiovascular: Negative for chest pain.  Gastrointestinal: Positive for nausea, vomiting, abdominal pain and diarrhea. Negative for constipation and blood in stool.  Genitourinary: Positive for vaginal discharge, genital sores and pelvic pain. Negative for vaginal bleeding and difficulty urinating.  Musculoskeletal: Negative for back pain.  Skin: Negative for rash.  Neurological: Negative for syncope and headaches.    Allergies  Review of patient's allergies indicates no known allergies.  Home Medications   Current Outpatient Rx  Name  Route  Sig  Dispense  Refill  . estradiol (ESTRACE) 1 MG tablet   Oral   Take 1 mg by mouth daily.         . famotidine (PEPCID) 20 MG tablet   Oral   Take 1 tablet (20 mg total) by mouth 2 (two) times daily.   10 tablet   0   . FLUoxetine (PROZAC) 20 MG capsule   Oral   Take 20 mg by mouth daily.         Marland Kitchen gabapentin (NEURONTIN) 600 MG tablet   Oral   Take 600 mg by mouth 3 (three) times daily.         Marland Kitchen HYDROcodone-acetaminophen (NORCO)  5-325 MG per tablet   Oral   Take 1 tablet by mouth every 4 (four) hours as needed for pain.   10 tablet   0   . pantoprazole (PROTONIX) 40 MG tablet   Oral   Take 40 mg by mouth daily.         . promethazine (PHENERGAN) 25 MG tablet   Oral   Take 1 tablet (25 mg total) by mouth every 6 (six) hours as needed for nausea.   20 tablet   0   . sucralfate (CARAFATE) 1 G tablet   Oral   Take 1 tablet (1 g total) by mouth 4 (four) times daily.   60 tablet   0   . HYDROcodone-acetaminophen (NORCO/VICODIN) 5-325 MG per tablet   Oral   Take 1-2 tablets by mouth every 4 (four) hours as needed for  pain.   12 tablet   0    BP 125/79  Pulse 62  Temp(Src) 98 F (36.7 C) (Oral)  Resp 19  SpO2 96% Physical Exam  Nursing note and vitals reviewed. Constitutional: She is oriented to person, place, and time. She appears well-developed and well-nourished. No distress.  HENT:  Head: Normocephalic and atraumatic.  Eyes: Conjunctivae and EOM are normal.  Neck: Normal range of motion.  Cardiovascular: Normal rate, regular rhythm, normal heart sounds and intact distal pulses.  Exam reveals no gallop and no friction rub.   No murmur heard. Pulmonary/Chest: Effort normal and breath sounds normal. No respiratory distress. She has no wheezes. She has no rales.  Abdominal: Soft. She exhibits no distension. There is no tenderness. There is no guarding. Hernia confirmed negative in the right inguinal area and confirmed negative in the left inguinal area.  Genitourinary: There is no rash, tenderness or lesion on the right labia. There is no rash, tenderness or lesion on the left labia. There is tenderness around the vagina. No erythema around the vagina. No foreign body around the vagina. No signs of injury around the vagina. No vaginal discharge found.  Small 1cm cystic lesion felt anteriorly and to the right on digital vaginal exam, round, regular, tender per patient. No sign of abnormality with speculum. Pt s/p total hysterectomy No sign of bartholin cyst with normal labia minora and no vaginal abnormalities visualized.  No tenderness labia minora or majora.  Tenderness mons pubis towards right and area of swelling noted not consistent with abscess or hernia.   Musculoskeletal: She exhibits no edema and no tenderness.  Neurological: She is alert and oriented to person, place, and time.  Skin: Skin is warm and dry. No rash noted. She is not diaphoretic. No erythema.    ED Course   Procedures (including critical care time)  Labs Reviewed  GC/CHLAMYDIA PROBE AMP   Ct Abdomen Pelvis W  Contrast  03/16/2013   *RADIOLOGY REPORT*  Clinical Data: Generalized abdominal pain.  Diagnosed with diverticulitis.  Pain worsening after eating.  Nausea and vomiting.  CT ABDOMEN AND PELVIS WITH CONTRAST  Technique:  Multidetector CT imaging of the abdomen and pelvis was performed following the standard protocol during bolus administration of intravenous contrast.  Contrast: OMNIPAQUE IOHEXOL 300 MG/ML  SOLN  Comparison: 03/16/2011  Findings: Atelectasis in the lung bases.  Diffuse fatty infiltration of the liver.  The spleen, gallbladder, pancreas, adrenal glands, abdominal aorta, inferior vena cava, retroperitoneal lymph nodes, and kidneys are unremarkable.  The stomach and small bowel are not abnormally distended and there is no wall thickening.  Stool filled colon without distension. Abdominal wall musculature appears intact.  No free air or free fluid in the abdomen.  Pelvis:  The uterus is surgically absent.  No abnormal adnexal masses.  Diverticula in the sigmoid colon without evidence of diverticulitis.  Appendix is surgically absent. The enlarged lymph node in the left external iliac chain, stable since previous study. Cyst adjacent to the right side of the vaginal region likely represents a Bartholin gland or Skene's gland cyst.  This was present on previous MRI pelvis 12/23/2012. Normal alignment of the lumbar vertebrae.  No destructive bone lesions.  IMPRESSION: The no acute changes since previous study.  Again demonstrated is fatty infiltration of the liver.  Cystic structure adjacent to the right side of the vagina.  Mildly enlarged left iliac lymph node. No evidence of diverticulitis or other acute process.   Original Report Authenticated By: Burman Nieves, M.D.   1. Abdominal pain   2. Vaginal pain   3. Swelling, cyst to right of internal vaginal wall apparent on CT and MR from 11/2012     MDM  2 old female with a history of diverticulitis and reported history of Bartholin's cyst  with emergency department visit yesterday for abdominal pain presents with continued abdominal pain present for months, and increasing abdominal pain at her "Bartholin's cyst" site.  Patient without signs of Bartholin's cyst on exam, however CT scan done yesterday with comparison to MR on 11/2012 she is concerned of cyst to the right of the vagina.  Patient has tenderness over the mons pubis to the right, and also noted she have vaginal tenderness and cyst palpated anteriorly in the vaginal canal on digital exam.  Area palpated does not appear to have fluctuance consistent with abscess, patient afebrile, and suspect that this represents other cystic structure rather than abscess. Patient had a urinalysis yesterday which showed no signs of urinary tract infection.  Patient is status post hysterectomy and oophorectomy without possibility of PID, ectopic pregnancy, tubo-ovarian abscess.  GC chlamydia ordered and pending.  Wet prep yesterday without abnormalities.  Provided patient pain medication and recommended close outpatient follow up with PCP and OBGYN regarding cyst present on May MR and CT yesterday and vaginal tenderness possible cyst on digital exam. Patient discharged in stable condition with understanding of reasons to return.   Rhae Lerner, MD 03/17/13 [phone]

## 2013-03-17 LAB — URINE CULTURE

## 2013-03-17 MED ORDER — HYDROCODONE-ACETAMINOPHEN 5-325 MG PO TABS: 1.0000 | ORAL_TABLET | ORAL | Status: DC | PRN

## 2013-03-17 MED ORDER — ONDANSETRON 4 MG PO TBDP
4.0000 mg | ORAL_TABLET | Freq: Once | ORAL | Status: AC
  Administered 2013-03-17: 4 mg via ORAL
  Filled 2013-03-17: qty 1

## 2013-03-17 NOTE — ED Provider Notes (Signed)
Medical screening examination/treatment/procedure(s) were conducted as a shared visit with resident physician and myself.  I personally evaluated the patient during the encounter.  I examined/interviewed the pt. I do not think the pt has a bartholins cyst after physical exam. I am not sure if the cystic structure noted on CT/MR is related to the pt's sx. She is otherwise in no acute distress. Will have her f/u w/ obgyn.   Junius Argyle, MD 03/17/13 1122

## 2013-04-29 ENCOUNTER — Encounter: Payer: Medicaid Other | Admitting: Obstetrics & Gynecology

## 2014-02-12 ENCOUNTER — Encounter (HOSPITAL_COMMUNITY): Payer: Self-pay | Admitting: Emergency Medicine

## 2014-02-12 ENCOUNTER — Emergency Department (HOSPITAL_COMMUNITY)
Admission: EM | Admit: 2014-02-12 | Discharge: 2014-02-12 | Disposition: A | Discharge status: Home / Self Care | Payer: Medicaid Other | Attending: Emergency Medicine | Admitting: Emergency Medicine

## 2014-02-12 DIAGNOSIS — Z9089 Acquired absence of other organs: Secondary | ICD-10-CM | POA: Insufficient documentation

## 2014-02-12 DIAGNOSIS — R1030 Lower abdominal pain, unspecified: Secondary | ICD-10-CM

## 2014-02-12 DIAGNOSIS — Z8719 Personal history of other diseases of the digestive system: Secondary | ICD-10-CM | POA: Diagnosis not present

## 2014-02-12 DIAGNOSIS — R109 Unspecified abdominal pain: Secondary | ICD-10-CM | POA: Diagnosis not present

## 2014-02-12 DIAGNOSIS — Z8742 Personal history of other diseases of the female genital tract: Secondary | ICD-10-CM | POA: Insufficient documentation

## 2014-02-12 DIAGNOSIS — R110 Nausea: Secondary | ICD-10-CM | POA: Insufficient documentation

## 2014-02-12 DIAGNOSIS — R509 Fever, unspecified: Secondary | ICD-10-CM | POA: Insufficient documentation

## 2014-02-12 DIAGNOSIS — Z90710 Acquired absence of both cervix and uterus: Secondary | ICD-10-CM | POA: Insufficient documentation

## 2014-02-12 DIAGNOSIS — Z79899 Other long term (current) drug therapy: Secondary | ICD-10-CM | POA: Diagnosis not present

## 2014-02-12 LAB — COMPREHENSIVE METABOLIC PANEL
ALBUMIN: 3.6 g/dL (ref 3.5–5.2)
ALK PHOS: 59 U/L (ref 39–117)
ALT: 19 U/L (ref 0–35)
ANION GAP: 13 (ref 5–15)
AST: 22 U/L (ref 0–37)
BUN: 15 mg/dL (ref 6–23)
CO2: 25 mEq/L (ref 19–32)
Calcium: 9.3 mg/dL (ref 8.4–10.5)
Chloride: 101 mEq/L (ref 96–112)
Creatinine, Ser: 0.57 mg/dL (ref 0.50–1.10)
GFR calc Af Amer: >90 mL/min (ref >90)
GFR calc non Af Amer: >90 mL/min (ref >90)
Glucose, Bld: 109 mg/dL — ABNORMAL HIGH (ref 70–99)
POTASSIUM: 4.1 meq/L (ref 3.7–5.3)
SODIUM: 139 meq/L (ref 137–147)
TOTAL PROTEIN: 7 g/dL (ref 6.0–8.3)
Total Bilirubin: 0.3 mg/dL (ref 0.3–1.2)

## 2014-02-12 LAB — CBC WITH DIFFERENTIAL/PLATELET
BASOS PCT: 1 % (ref 0–1)
Basophils Absolute: 0 10*3/uL (ref 0.0–0.1)
EOS ABS: 0.2 10*3/uL (ref 0.0–0.7)
Eosinophils Relative: 3 % (ref 0–5)
HCT: 40.2 % (ref 36.0–46.0)
Hemoglobin: 13.1 g/dL (ref 12.0–15.0)
Lymphocytes Relative: 44 % (ref 12–46)
Lymphs Abs: 2.8 10*3/uL (ref 0.7–4.0)
MCH: 29.3 pg (ref 26.0–34.0)
MCHC: 32.6 g/dL (ref 30.0–36.0)
MCV: 89.9 fL (ref 78.0–100.0)
MONOS PCT: 8 % (ref 3–12)
Monocytes Absolute: 0.5 10*3/uL (ref 0.1–1.0)
NEUTROS PCT: 44 % (ref 43–77)
Neutro Abs: 2.9 10*3/uL (ref 1.7–7.7)
PLATELETS: 299 10*3/uL (ref 150–400)
RBC: 4.47 MIL/uL (ref 3.87–5.11)
RDW: 13.1 % (ref 11.5–15.5)
WBC: 6.4 10*3/uL (ref 4.0–10.5)

## 2014-02-12 LAB — URINALYSIS, ROUTINE W REFLEX MICROSCOPIC
BILIRUBIN URINE: NEGATIVE
Glucose, UA: NEGATIVE mg/dL
KETONES UR: NEGATIVE mg/dL
Leukocytes, UA: NEGATIVE
NITRITE: NEGATIVE
PROTEIN: NEGATIVE mg/dL
Specific Gravity, Urine: 1.025 (ref 1.005–1.030)
UROBILINOGEN UA: 0.2 mg/dL (ref 0.0–1.0)
pH: 5.5 (ref 5.0–8.0)

## 2014-02-12 LAB — URINE MICROSCOPIC-ADD ON

## 2014-02-12 MED ORDER — MORPHINE SULFATE 4 MG/ML IJ SOLN
4.0000 mg | Freq: Once | INTRAMUSCULAR | Status: AC
  Administered 2014-02-12: 4 mg via INTRAVENOUS
  Filled 2014-02-12: qty 1

## 2014-02-12 NOTE — ED Provider Notes (Signed)
Medical screening examination/treatment/procedure(s) were performed by non-physician practitioner and as supervising physician I was immediately available for consultation/collaboration.   EKG Interpretation None        Layla MawKristen N Lorimer Tiberio, DO 02/12/14 2301

## 2014-02-12 NOTE — ED Notes (Signed)
Pt had bladder tack on 6/17.  Pt states she is now having lower abdominal pain starting yesterday.  Pain does not increase with urination. Fever urination.  Urine yellow.  Slight odor.  Nausea with no vomiting

## 2014-02-12 NOTE — Discharge Instructions (Signed)
Followup with your urologist. Avoid any heavy lifting or other physical activity until followup.  Abdominal Pain Many things can cause abdominal pain. Usually, abdominal pain is not caused by a disease and will improve without treatment. It can often be observed and treated at home. Your health care provider will do a physical exam and possibly order blood tests and X-rays to help determine the seriousness of your pain. However, in many cases, more time must pass before a clear cause of the pain can be found. Before that point, your health care provider may not know if you need more testing or further treatment. HOME CARE INSTRUCTIONS  Monitor your abdominal pain for any changes. The following actions may help to alleviate any discomfort you are experiencing:  Only take over-the-counter or prescription medicines as directed by your health care provider.  Do not take laxatives unless directed to do so by your health care provider.  Try a clear liquid diet (broth, tea, or water) as directed by your health care provider. Slowly move to a bland diet as tolerated. SEEK MEDICAL CARE IF:  You have unexplained abdominal pain.  You have abdominal pain associated with nausea or diarrhea.  You have pain when you urinate or have a bowel movement.  You experience abdominal pain that wakes you in the night.  You have abdominal pain that is worsened or improved by eating food.  You have abdominal pain that is worsened with eating fatty foods.  You have a fever. SEEK IMMEDIATE MEDICAL CARE IF:   Your pain does not go away within 2 hours.  You keep throwing up (vomiting).  Your pain is felt only in portions of the abdomen, such as the right side or the left lower portion of the abdomen.  You pass bloody or black tarry stools. MAKE SURE YOU:  Understand these instructions.   Will watch your condition.   Will get help right away if you are not doing well or get worse.  Document Released:  04/25/2005 Document Revised: 07/21/2013 Document Reviewed: 03/25/2013 [facility] Patient Information 2015 [place], [place]. This information is not intended to replace advice given to you by your health care provider. Make sure you discuss any questions you have with your health care provider.

## 2014-02-12 NOTE — ED Provider Notes (Signed)
CSN: [id number]     Arrival date & time 02/12/14  1756 History   First MD Initiated Contact with Patient 02/12/14 1931     Chief Complaint  Patient presents with  . Abdominal Pain     (Consider location/radiation/quality/duration/timing/severity/associated sxs/prior Treatment) HPI Comments: 48 year old female with a past medical history of diverticulitis presents to the emergency department complaining of lower abdominal pain x2 days. Abdominal pain described as a constant cramping with intermittent sharp pains. No aggravating or alleviating factors. She tried taking Vicodin with minimal relief. She had a bladder tack surgery on June 17. No complications. States she does not feel like she is completely emptying her bladder. States her urine has a slight odor and feels pressure in her bladder when she urinates. Denies dysuria. Admits to nausea without vomiting. Normal bowel movements. States she has been running a subjective low-grade fever. Denies abdominal distention. History of abdominal hysterectomy and appendectomy.  Patient is a 48 y.o. female presenting with abdominal pain. The history is provided by the patient.  Abdominal Pain Associated symptoms: fever (subjective) and nausea     Past Medical History  Diagnosis Date  . Diverticulitis   . Bartholin cyst    Past Surgical History  Procedure Laterality Date  . Abdominal hysterectomy    . Appendectomy     History reviewed. No pertinent family history. History  Substance Use Topics  . Smoking status: Never Smoker   . Smokeless tobacco: Not on file  . Alcohol Use: No   OB History   Grav Para Term Preterm Abortions TAB SAB Ect Mult Living                 Review of Systems  Constitutional: Positive for fever (subjective).  Gastrointestinal: Positive for nausea and abdominal pain.  Genitourinary:       + feeling of not being able to empty bladder.  All other systems reviewed and are negative.     Allergies  Review of  patient's allergies indicates no known allergies.  Home Medications   Prior to Admission medications   Medication Sig Start Date End Date Taking? Authorizing Provider  atenolol (TENORMIN) 50 MG tablet Take 50 mg by mouth daily.   Yes Historical Provider, MD  DULoxetine (CYMBALTA) 30 MG capsule Take 30 mg by mouth 2 (two) times daily.   Yes Historical Provider, MD  estradiol (ESTRACE) 1 MG tablet Take 1 mg by mouth daily.   Yes Historical Provider, MD  famotidine (PEPCID) 20 MG tablet Take 1 tablet (20 mg total) by mouth 2 (two) times daily. 03/16/13  Yes [name] S Dammen, PA-C  gabapentin (NEURONTIN) 600 MG tablet Take 600 mg by mouth 3 (three) times daily.   Yes Historical Provider, MD  HYDROcodone-acetaminophen (NORCO) 5-325 MG per tablet Take 1 tablet by mouth every 4 (four) hours as needed for pain. 03/16/13  Yes [name] S Dammen, PA-C  pantoprazole (PROTONIX) 40 MG tablet Take 40 mg by mouth daily.   Yes Historical Provider, MD  sucralfate (CARAFATE) 1 G tablet Take 1 tablet (1 g total) by mouth 4 (four) times daily. 03/16/13  Yes [name] S Dammen, PA-C   BP 126/88  Pulse 81  Temp(Src) 97.8 F (36.6 C) (Oral)  Resp 18  SpO2 96% Physical Exam  Nursing note and vitals reviewed. Constitutional: She is oriented to person, place, and time. She appears well-developed and well-nourished. No distress.  HENT:  Head: Normocephalic and atraumatic.  Mouth/Throat: Oropharynx is clear and moist.  Eyes: Conjunctivae  are normal. No scleral icterus.  Neck: Normal range of motion. Neck supple.  Cardiovascular: Normal rate, regular rhythm and normal heart sounds.   Pulmonary/Chest: Effort normal and breath sounds normal.  Abdominal: Soft. Normal appearance and bowel sounds are normal. She exhibits no distension. There is tenderness in the suprapubic area. There is no rigidity, no rebound and no guarding.  No peritoneal signs.  Musculoskeletal: Normal range of motion. She exhibits no edema.  Neurological:  She is alert and oriented to person, place, and time.  Skin: Skin is warm and dry. She is not diaphoretic.  Psychiatric: She has a normal mood and affect. Her behavior is normal.    ED Course  Procedures (including critical care time) Labs Review Labs Reviewed  URINALYSIS, ROUTINE W REFLEX MICROSCOPIC - Abnormal; Notable for the following:    Hgb urine dipstick TRACE (*)    All other components within normal limits  COMPREHENSIVE METABOLIC PANEL - Abnormal; Notable for the following:    Glucose, Bld 109 (*)    All other components within normal limits  CBC WITH DIFFERENTIAL  URINE MICROSCOPIC-ADD ON    Imaging Review No results found.   EKG Interpretation None      MDM   Final diagnoses:  Lower abdominal pain   Pt presenting with suprapubic abdominal pain, sensation of being unable to empty bladder. Bladder tac performed in [place]. Pt well appearing and in NAD. AFVSS. Labs, UA pending. 9:18 PM Labs without any acute finding. Urinalysis without signs of infection. Trace hemoglobin. Patient reports her pain has greatly improved after receiving morphine. She admits that she has been doing more activity than she probably should be, it was advised for her to no lifting over 5 pounds for 6 weeks post surgery. It has been about 5 weeks. She's been playing frequently with her new grandson. I advised her to avoid any heavy lifting or significant physical activity until followup with her urologist. Stable for discharge. She has Vicodin at home. Return precautions given. Patient states understanding of treatment care plan and is agreeable.  Trevor MaceRobyn M Albert, PA-C 02/12/14 2119

## 2014-06-13 ENCOUNTER — Emergency Department (HOSPITAL_COMMUNITY)
Admission: EM | Admit: 2014-06-13 | Discharge: 2014-06-13 | Disposition: A | Discharge status: Home / Self Care | Payer: Medicaid Other | Attending: Emergency Medicine | Admitting: Emergency Medicine

## 2014-06-13 ENCOUNTER — Encounter (HOSPITAL_COMMUNITY): Payer: Self-pay

## 2014-06-13 DIAGNOSIS — Z8719 Personal history of other diseases of the digestive system: Secondary | ICD-10-CM | POA: Diagnosis not present

## 2014-06-13 DIAGNOSIS — M5442 Lumbago with sciatica, left side: Secondary | ICD-10-CM | POA: Diagnosis not present

## 2014-06-13 DIAGNOSIS — Z791 Long term (current) use of non-steroidal anti-inflammatories (NSAID): Secondary | ICD-10-CM | POA: Diagnosis not present

## 2014-06-13 DIAGNOSIS — Z79899 Other long term (current) drug therapy: Secondary | ICD-10-CM | POA: Insufficient documentation

## 2014-06-13 DIAGNOSIS — M79605 Pain in left leg: Secondary | ICD-10-CM | POA: Diagnosis present

## 2014-06-13 DIAGNOSIS — M5432 Sciatica, left side: Secondary | ICD-10-CM

## 2014-06-13 MED ORDER — DIAZEPAM 5 MG PO TABS: 5.0000 mg | ORAL_TABLET | Freq: Two times a day (BID) | ORAL | Status: AC

## 2014-06-13 MED ORDER — DIAZEPAM 5 MG PO TABS
5.0000 mg | ORAL_TABLET | Freq: Once | ORAL | Status: AC
  Administered 2014-06-13: 5 mg via ORAL
  Filled 2014-06-13: qty 1

## 2014-06-13 MED ORDER — PREDNISONE 10 MG PO TABS: ORAL_TABLET | ORAL | Status: DC

## 2014-06-13 NOTE — ED Provider Notes (Signed)
CSN: [id number]     Arrival date & time 06/13/14  [id number] History   First MD Initiated Contact with Patient 06/13/14 1006     Chief Complaint  Patient presents with  . Leg Pain   HPI  Patient is a 48 y.o. 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  Patient states that she has worsening left leg pain she characterizes as sharp electric shooting down her left buttocks to slightly below her left knee.  Patient states that movement and heating pads help a little.  Pain is worse with sitting still and lying down.  Patient has tried hydrocodone with little relief.  Patient denies back pain at this time.  Patient denies personal history of cancer, history of frequent fracture, osteoporosis, saddle anesthesia, loss of bowel or bladder, or IV drug use.  Patient saw her PCP who told her "she has to live with the pain".    Past Medical History  Diagnosis Date  . Diverticulitis   . Bartholin cyst    Past Surgical History  Procedure Laterality Date  . Abdominal hysterectomy    . Appendectomy     No family history on file. History  Substance Use Topics  . Smoking status: Never Smoker   . Smokeless tobacco: Not on file  . Alcohol Use: No   OB History    No data available     Review of Systems  Constitutional: Negative for fever and chills.  Respiratory: Negative for chest tightness and shortness of breath.   Cardiovascular: Negative for chest pain and leg swelling.  Gastrointestinal: Negative for nausea and vomiting.  Genitourinary: Negative for dysuria, urgency, frequency, hematuria and difficulty urinating.  Musculoskeletal: Positive for myalgias, back pain and gait problem. Negative for joint swelling, arthralgias, neck pain and neck stiffness.  Skin: Negative for rash.  All other systems reviewed and are negative.     Allergies  Review of patient's allergies indicates no known allergies.  Home Medications   Prior to  Admission medications   Medication Sig Start Date End Date Taking? Authorizing Provider  atenolol (TENORMIN) 50 MG tablet Take 50 mg by mouth daily.   Yes Historical Provider, MD  estradiol (ESTRACE) 1 MG tablet Take 1 mg by mouth daily.   Yes Historical Provider, MD  famotidine (PEPCID) 20 MG tablet Take 1 tablet (20 mg total) by mouth 2 (two) times daily. 03/16/13  Yes [name] S Dammen, PA-C  gabapentin (NEURONTIN) 600 MG tablet Take 600 mg by mouth 3 (three) times daily.   Yes Historical Provider, MD  HYDROcodone-acetaminophen (NORCO) 10-325 MG per tablet Take 1 tablet by mouth every 4 (four) hours as needed for moderate pain.   Yes Historical Provider, MD  meloxicam (MOBIC) 15 MG tablet Take 15 mg by mouth daily.   Yes Historical Provider, MD  pantoprazole (PROTONIX) 40 MG tablet Take 40 mg by mouth daily.   Yes Historical Provider, MD  sucralfate (CARAFATE) 1 G tablet Take 1 tablet (1 g total) by mouth 4 (four) times daily. Patient taking differently: Take 1 g by mouth 4 (four) times daily as needed (flare ups.).  03/16/13  Yes [name] S Dammen, PA-C  diazepam (VALIUM) 5 MG tablet Take 1 tablet (5 mg total) by mouth 2 (two) times daily. 06/13/14   Ayiana Winslett A Forcucci, PA-C  HYDROcodone-acetaminophen (NORCO) 5-325 MG per tablet Take 1 tablet by mouth every 4 (four) hours as needed for  pain. 03/16/13   Phill MutterPeter S Dammen, PA-C  predniSONE (DELTASONE) 10 MG tablet Day 1 take 6 pills Day 2 take 5 pills Day 3 take 4 pills Day 4 take 3 pills Day 5 take 2 pills Day 6 take 1 pill 06/13/14   Julianne Chamberlin A Forcucci, PA-C   BP 126/89 mmHg  Pulse 81  Temp(Src) 98.8 F (37.1 C) (Oral)  Resp 16  SpO2 99% Physical Exam  Constitutional: She is oriented to person, place, and time. She appears well-developed and well-nourished. No distress.  HENT:  Head: Normocephalic and atraumatic.  Mouth/Throat: Oropharynx is clear and moist. No oropharyngeal exudate.  Eyes: Conjunctivae and EOM are normal. Pupils are equal,  round, and reactive to light. No scleral icterus.  Neck: Normal range of motion. Neck supple. No JVD present. No thyromegaly present.  Cardiovascular: Normal rate, regular rhythm, normal heart sounds and intact distal pulses.  Exam reveals no gallop and no friction rub.   No murmur heard. Pulmonary/Chest: Effort normal and breath sounds normal. No respiratory distress. She has no wheezes. She has no rales. She exhibits no tenderness.  Abdominal: Soft. Bowel sounds are normal. She exhibits no distension and no mass. There is no tenderness. There is no rebound and no guarding.  Musculoskeletal: Normal range of motion.  Patient rises slowly from sitting to standing.  They walk without an antalgic gait.  There is no evidence of erythema, ecchymosis, or gross deformity.  There is tenderness to palpation over left buttocks and bilateral lumbar paraspinal muscles.  Active ROM is limited due to pain.  Sensation to light touch is intact over all extremities.  Strength is symmetric and equal in all extremities.  Positive left straight leg raise reproduces shooting pains  Lymphadenopathy:    She has no cervical adenopathy.  Neurological: She is alert and oriented to person, place, and time. She has normal strength. No cranial nerve deficit or sensory deficit. Coordination normal.  Skin: Skin is warm and dry. She is not diaphoretic.  Psychiatric: She has a normal mood and affect. Her behavior is normal. Judgment and thought content normal.  Nursing note and vitals reviewed.   ED Course  Procedures (including critical care time) Labs Review Labs Reviewed - No data to display  Imaging Review No results found.   EKG Interpretation None      MDM   Final diagnoses:  Sciatica, left    Patient is a 48 y.o. Female who presents to the ED with left leg pain.  There are no red flag symptoms for cauda equina.  There is low back tenderness to palpation.  Suspect that this is likely sciatica.   Patient  already has pain medications.  Will discharge the patient home with prednisone and valium.  Patient to follow-up with her doctor if no improvement.  Patient to return here for loss of bowel or bladder or saddle anesthesia.  Discussed the possible need for outpatient MRI if no improvement of symptoms and referral to a spine surgeon.  Patient states understanding and agreement to the above plan.  Patient stable for discharge.      Eben Burowourtney A Forcucci, PA-C 06/13/14 1052  Vida RollerBrian D Miller, MD 06/13/14 [phone]

## 2014-06-13 NOTE — Discharge Instructions (Signed)
Sciatica Sciatica is pain, weakness, numbness, or tingling along the path of the sciatic nerve. The nerve starts in the lower back and runs down the back of each leg. The nerve controls the muscles in the lower leg and in the back of the knee, while also providing sensation to the back of the thigh, lower leg, and the sole of your foot. Sciatica is a symptom of another medical condition. For instance, nerve damage or certain conditions, such as a herniated disk or bone spur on the spine, pinch or put pressure on the sciatic nerve. This causes the pain, weakness, or other sensations normally associated with sciatica. Generally, sciatica only affects one side of the body. CAUSES   Herniated or slipped disc.  Degenerative disk disease.  A pain disorder involving the narrow muscle in the buttocks (piriformis syndrome).  Pelvic injury or fracture.  Pregnancy.  Tumor (rare). SYMPTOMS  Symptoms can vary from mild to very severe. The symptoms usually travel from the low back to the buttocks and down the back of the leg. Symptoms can include:  Mild tingling or dull aches in the lower back, leg, or hip.  Numbness in the back of the calf or sole of the foot.  Burning sensations in the lower back, leg, or hip.  Sharp pains in the lower back, leg, or hip.  Leg weakness.  Severe back pain inhibiting movement. These symptoms may get worse with coughing, sneezing, laughing, or prolonged sitting or standing. Also, being overweight may worsen symptoms. DIAGNOSIS  Your caregiver will perform a physical exam to look for common symptoms of sciatica. He or she may ask you to do certain movements or activities that would trigger sciatic nerve pain. Other tests may be performed to find the cause of the sciatica. These may include:  Blood tests.  X-rays.  Imaging tests, such as an MRI or CT scan. TREATMENT  Treatment is directed at the cause of the sciatic pain. Sometimes, treatment is not necessary  and the pain and discomfort goes away on its own. If treatment is needed, your caregiver may suggest:  Over-the-counter medicines to relieve pain.  Prescription medicines, such as anti-inflammatory medicine, muscle relaxants, or narcotics.  Applying heat or ice to the painful area.  Steroid injections to lessen pain, irritation, and inflammation around the nerve.  Reducing activity during periods of pain.  Exercising and stretching to strengthen your abdomen and improve flexibility of your spine. Your caregiver may suggest losing weight if the extra weight makes the back pain worse.  Physical therapy.  Surgery to eliminate what is pressing or pinching the nerve, such as a bone spur or part of a herniated disk. HOME CARE INSTRUCTIONS   Only take over-the-counter or prescription medicines for pain or discomfort as directed by your caregiver.  Apply ice to the affected area for 20 minutes, 3-4 times a day for the first 48-72 hours. Then try heat in the same way.  Exercise, stretch, or perform your usual activities if these do not aggravate your pain.  Attend physical therapy sessions as directed by your caregiver.  Keep all follow-up appointments as directed by your caregiver.  Do not wear high heels or shoes that do not provide proper support.  Check your mattress to see if it is too soft. A firm mattress may lessen your pain and discomfort. SEEK IMMEDIATE MEDICAL CARE IF:   You lose control of your bowel or bladder (incontinence).  You have increasing weakness in the lower back, pelvis, buttocks,   or legs.  You have redness or swelling of your back.  You have a burning sensation when you urinate.  You have pain that gets worse when you lie down or awakens you at night.  Your pain is worse than you have experienced in the past.  Your pain is lasting longer than 4 weeks.  You are suddenly losing weight without reason. MAKE SURE YOU:  Understand these  instructions.  Will watch your condition.  Will get help right away if you are not doing well or get worse. Document Released: 07/10/2001 Document Revised: 01/15/2012 Document Reviewed: 11/25/2011 [employer] Patient Information 2015 [employer]. This information is not intended to replace advice given to you by your health care provider. Make sure you discuss any questions you have with your health care provider.  

## 2014-06-13 NOTE — ED Notes (Signed)
Bed: WTR8 Expected date:  Expected time:  Means of arrival:  Comments: 

## 2014-06-13 NOTE — ED Notes (Signed)
She c/o pain left hip/leg since stopping unexpectedly and suddenly while bowling about a week ago.  CMS intact all toes bilat.

## 2014-12-01 ENCOUNTER — Emergency Department (HOSPITAL_COMMUNITY)
Admission: EM | Admit: 2014-12-01 | Discharge: 2014-12-01 | Disposition: A | Discharge status: Home / Self Care | Payer: Medicaid Other | Attending: Emergency Medicine | Admitting: Emergency Medicine

## 2014-12-01 ENCOUNTER — Encounter (HOSPITAL_COMMUNITY): Payer: Self-pay | Admitting: *Deleted

## 2014-12-01 DIAGNOSIS — M5432 Sciatica, left side: Secondary | ICD-10-CM | POA: Insufficient documentation

## 2014-12-01 DIAGNOSIS — Z79899 Other long term (current) drug therapy: Secondary | ICD-10-CM | POA: Diagnosis not present

## 2014-12-01 DIAGNOSIS — M545 Low back pain: Secondary | ICD-10-CM | POA: Diagnosis present

## 2014-12-01 DIAGNOSIS — Z791 Long term (current) use of non-steroidal anti-inflammatories (NSAID): Secondary | ICD-10-CM | POA: Diagnosis not present

## 2014-12-01 DIAGNOSIS — R202 Paresthesia of skin: Secondary | ICD-10-CM | POA: Insufficient documentation

## 2014-12-01 DIAGNOSIS — G8929 Other chronic pain: Secondary | ICD-10-CM | POA: Diagnosis not present

## 2014-12-01 DIAGNOSIS — Z8719 Personal history of other diseases of the digestive system: Secondary | ICD-10-CM | POA: Insufficient documentation

## 2014-12-01 DIAGNOSIS — M549 Dorsalgia, unspecified: Secondary | ICD-10-CM

## 2014-12-01 DIAGNOSIS — Z8742 Personal history of other diseases of the female genital tract: Secondary | ICD-10-CM | POA: Insufficient documentation

## 2014-12-01 DIAGNOSIS — R252 Cramp and spasm: Secondary | ICD-10-CM | POA: Insufficient documentation

## 2014-12-01 MED ORDER — PREDNISONE 20 MG PO TABS: ORAL_TABLET | ORAL | Status: DC

## 2014-12-01 NOTE — Discharge Instructions (Signed)
Back Pain:  Your back pain should be treated with medicines such as ibuprofen or aleve and this back pain should get better over the next 2 weeks.  However if you develop severe or worsening pain, low back pain with fever, numbness, weakness or inability to walk or urinate, you should return to the ER immediately.  Please follow up with your doctor this week for a recheck if still having symptoms. Avoid heavy lifting, nothing over 10 pounds for the next two weeks.  Low back pain is discomfort in the lower back that may be due to injuries to muscles and ligaments around the spine.  Occasionally, it may be caused by a a problem to a part of the spine called a disc.  The pain may last several days or a week;  However, most patients get completely well in 4 weeks.  Self - care:  The application of heat can help soothe the pain.  Maintaining your daily activities, including walking, is encourged, as it will help you get better faster than just staying in bed. Perform gentle stretching as discussed. Drink plenty of fluids.  Medications are also useful to help with pain control.  Use your home medications for pain.   Prednisone: use this to help with your sciatica symptoms.  Non steroidal anti inflammatory medications including Ibuprofen and mobic;  These medications help both pain and swelling and are very useful in treating back pain.  They should be taken with food, as they can cause stomach upset, and more seriously, stomach bleeding.    Muscle relaxants (flexeril):  These medications can help with muscle tightness that is a cause of lower back pain.  Most of these medications can cause drowsiness, and it is not safe to drive or use dangerous machinery while taking them.  SEEK IMMEDIATE MEDICAL ATTENTION IF: New numbness, tingling, weakness, or problem with the use of your arms or legs.  Severe back pain not relieved with medications.  Difficulty with or loss of control of your bowel or bladder  control.  Increasing pain in any areas of the body (such as chest or abdominal pain).  Shortness of breath, dizziness or fainting.  Nausea (feeling sick to your stomach), vomiting, fever, or sweats.  You will need to follow up with  Your primary healthcare provider in 1-2 weeks for reassessment.   Chronic Back Pain  When back pain lasts longer than 3 months, it is called chronic back pain.People with chronic back pain often go through certain periods that are more intense (flare-ups).  CAUSES Chronic back pain can be caused by wear and tear (degeneration) on different structures in your back. These structures include:  The bones of your spine (vertebrae) and the joints surrounding your spinal cord and nerve roots (facets).  The strong, fibrous tissues that connect your vertebrae (ligaments). Degeneration of these structures may result in pressure on your nerves. This can lead to constant pain. HOME CARE INSTRUCTIONS  Avoid bending, heavy lifting, prolonged sitting, and activities which make the problem worse.  Take brief periods of rest throughout the day to reduce your pain. Lying down or standing usually is better than sitting while you are resting.  Take over-the-counter or prescription medicines only as directed by your caregiver. SEEK IMMEDIATE MEDICAL CARE IF:   You have weakness or numbness in one of your legs or feet.  You have trouble controlling your bladder or bowels.  You have nausea, vomiting, abdominal pain, shortness of breath, or fainting. Document Released: 08/23/2004  Document Revised: 10/08/2011 Document Reviewed: 06/30/2011 [facility] Patient Information 2015 [place], [place]. This information is not intended to replace advice given to you by your health care provider. Make sure you discuss any questions you have with your health care provider.  Back Injury Prevention The following tips can help you to prevent a back injury. PHYSICAL FITNESS  Exercise often. Try  to develop strong stomach (abdominal) muscles.  Do aerobic exercises often. This includes walking, jogging, biking, swimming.  Do exercises that help with balance and strength often. This includes tai chi and yoga.  Stretch before and after you exercise.  Keep a healthy weight. DIET   Ask your doctor how much calcium and vitamin D you need every day.  Include calcium in your diet. Foods high in calcium include dairy products; green, leafy vegetables; and products with calcium added (fortified).  Include vitamin D in your diet. Foods high in vitamin D include milk and products with vitamin D added.  Think about taking a multivitamin or other nutritional products called " supplements."  Stop smoking if you smoke. POSTURE   Sit and stand up straight. Avoid leaning forward or hunching over.  Choose chairs that support your lower back.  If you work at a desk:  Sit close to your work so you do not lean over.  Keep your chin tucked in.  Keep your neck drawn back.  Keep your elbows bent at a right angle. Your arms should look like the letter "L."  Sit high and close to the steering wheel when you drive. Add low back support to your car seat if needed.  Avoid sitting or standing in one position for too long. Get up and move around every hour. Take breaks if you are driving for a long time.  Sleep on your side with your knees slightly bent. You can also sleep on your back with a pillow under your knees. Do not sleep on your stomach. LIFTING, TWISTING, AND REACHING  Avoid heavy lifting, especially lifting over and over again. If you must do heavy lifting:  Stretch before lifting.  Work slowly.  Rest between lifts.  Use carts and dollies to move objects when possible.  Make several small trips instead of carrying 1 heavy load.  Ask for help when you need it.  Ask for help when moving big, awkward objects.  Follow these steps when lifting:  Stand with your feet  shoulder-width apart.  Get as close to the object as you can. Do not pick up heavy objects that are far from your body.  Use handles or lifting straps when possible.  Bend at your knees. Squat down, but keep your heels off the floor.  Keep your shoulders back, your chin tucked in, and your back straight.  Lift the object slowly. Tighten the muscles in your legs, stomach, and butt. Keep the object as close to the center of your body as possible.  Reverse these directions when you put a load down.  Do not:  Lift the object above your waist.  Twist at the waist while lifting or carrying a load. Move your feet if you need to turn, not your waist.  Bend over without bending at your knees.  Avoid reaching over your head, across a table, or for an object on a high surface. OTHER TIPS  Avoid wet floors and keep sidewalks clear of ice.  Do not sleep on a mattress that is too soft or too hard.  Keep items that you use  often within easy reach.  Put heavier objects on shelves at waist level. Put lighter objects on lower or higher shelves.  Find ways to lessen your stress. You can try exercise, massage, or relaxation.  Get help for depression or anxiety if needed. GET HELP IF:  You injure your back.  You have questions about diet, exercise, or other ways to prevent back injuries. MAKE SURE YOU:  Understand these instructions.  Will watch your condition.  Will get help right away if you are not doing well or get worse. Document Released: 01/02/2008 Document Revised: 10/08/2011 Document Reviewed: 08/27/2011 [facility] Patient Information 2015 [place], [place]. This information is not intended to replace advice given to you by your health care provider. Make sure you discuss any questions you have with your health care provider.  Back Exercises Back exercises help treat and prevent back injuries. The goal is to increase your strength in your belly (abdominal) and back muscles. These  exercises can also help with flexibility. Start these exercises when told by your doctor. HOME CARE Back exercises include: Pelvic Tilt.  Lie on your back with your knees bent. Tilt your pelvis until the lower part of your back is against the floor. Hold this position 5 to 10 sec. Repeat this exercise 5 to 10 times. Knee to Chest.  Pull 1 knee up against your chest and hold for 20 to 30 seconds. Repeat this with the other knee. This may be done with the other leg straight or bent, whichever feels better. Then, pull both knees up against your chest. Sit-Ups or Curl-Ups.  Bend your knees 90 degrees. Start with tilting your pelvis, and do a partial, slow sit-up. Only lift your upper half 30 to 45 degrees off the floor. Take at least 2 to 3 seonds for each sit-up. Do not do sit-ups with your knees out straight. If partial sit-ups are difficult, simply do the above but with only tightening your belly (abdominal) muscles and holding it as told. Hip-Lift.  Lie on your back with your knees flexed 90 degrees. Push down with your feet and shoulders as you raise your hips 2 inches off the floor. Hold for 10 seconds, repeat 5 to 10 times. Back Arches.  Lie on your stomach. Prop yourself up on bent elbows. Slowly press on your hands, causing an arch in your low back. Repeat 3 to 5 times. Shoulder-Lifts.  Lie face down with arms beside your body. Keep hips and belly pressed to floor as you slowly lift your head and shoulders off the floor. Do not overdo your exercises. Be careful in the beginning. Exercises may cause you some mild back discomfort. If the pain lasts for more than 15 minutes, stop the exercises until you see your doctor. Improvement with exercise for back problems is slow.  Document Released: 08/18/2010 Document Revised: 10/08/2011 Document Reviewed: 05/17/2011 [facility] Patient Information 2015 [place], [place]. This information is not intended to replace advice given to you by your health  care provider. Make sure you discuss any questions you have with your health care provider.   Sciatica Sciatica is pain, weakness, numbness, or tingling along the path of the sciatic nerve. The nerve starts in the lower back and runs down the back of each leg. The nerve controls the muscles in the lower leg and in the back of the knee, while also providing sensation to the back of the thigh, lower leg, and the sole of your foot. Sciatica is a symptom of another medical condition.  For instance, nerve damage or certain conditions, such as a herniated disk or bone spur on the spine, pinch or put pressure on the sciatic nerve. This causes the pain, weakness, or other sensations normally associated with sciatica. Generally, sciatica only affects one side of the body. CAUSES   Herniated or slipped disc.  Degenerative disk disease.  A pain disorder involving the narrow muscle in the buttocks (piriformis syndrome).  Pelvic injury or fracture.  Pregnancy.  Tumor (rare). SYMPTOMS  Symptoms can vary from mild to very severe. The symptoms usually travel from the low back to the buttocks and down the back of the leg. Symptoms can include:  Mild tingling or dull aches in the lower back, leg, or hip.  Numbness in the back of the calf or sole of the foot.  Burning sensations in the lower back, leg, or hip.  Sharp pains in the lower back, leg, or hip.  Leg weakness.  Severe back pain inhibiting movement. These symptoms may get worse with coughing, sneezing, laughing, or prolonged sitting or standing. Also, being overweight may worsen symptoms. DIAGNOSIS  Your caregiver will perform a physical exam to look for common symptoms of sciatica. He or she may ask you to do certain movements or activities that would trigger sciatic nerve pain. Other tests may be performed to find the cause of the sciatica. These may include:  Blood tests.  X-rays.  Imaging tests, such as an MRI or CT scan. TREATMENT    Treatment is directed at the cause of the sciatic pain. Sometimes, treatment is not necessary and the pain and discomfort goes away on its own. If treatment is needed, your caregiver may suggest:  Over-the-counter medicines to relieve pain.  Prescription medicines, such as anti-inflammatory medicine, muscle relaxants, or narcotics.  Applying heat or ice to the painful area.  Steroid injections to lessen pain, irritation, and inflammation around the nerve.  Reducing activity during periods of pain.  Exercising and stretching to strengthen your abdomen and improve flexibility of your spine. Your caregiver may suggest losing weight if the extra weight makes the back pain worse.  Physical therapy.  Surgery to eliminate what is pressing or pinching the nerve, such as a bone spur or part of a herniated disk. HOME CARE INSTRUCTIONS   Only take over-the-counter or prescription medicines for pain or discomfort as directed by your caregiver.  Apply ice to the affected area for 20 minutes, 3-4 times a day for the first 48-72 hours. Then try heat in the same way.  Exercise, stretch, or perform your usual activities if these do not aggravate your pain.  Attend physical therapy sessions as directed by your caregiver.  Keep all follow-up appointments as directed by your caregiver.  Do not wear high heels or shoes that do not provide proper support.  Check your mattress to see if it is too soft. A firm mattress may lessen your pain and discomfort. SEEK IMMEDIATE MEDICAL CARE IF:   You lose control of your bowel or bladder (incontinence).  You have increasing weakness in the lower back, pelvis, buttocks, or legs.  You have redness or swelling of your back.  You have a burning sensation when you urinate.  You have pain that gets worse when you lie down or awakens you at night.  Your pain is worse than you have experienced in the past.  Your pain is lasting longer than 4 weeks.  You  are suddenly losing weight without reason. MAKE SURE YOU:  Understand  these instructions.  Will watch your condition.  Will get help right away if you are not doing well or get worse. Document Released: 07/10/2001 Document Revised: 01/15/2012 Document Reviewed: 11/25/2011 [facility] Patient Information 2015 [place], [place]. This information is not intended to replace advice given to you by your health care provider. Make sure you discuss any questions you have with your health care provider.

## 2014-12-01 NOTE — ED Provider Notes (Signed)
CSN: [id number]     Arrival date & time 12/01/14  2056 History  This chart was scribed for Sheila Strauss, PA-C, working with Sheila Berkshire, MD by Chestine Spore, ED Scribe. The patient was seen in room TR10C/TR10C at 9:29 PM.    Chief Complaint  Patient presents with  . Back Pain      Patient is a 49 y.o. female presenting with back pain. The history is provided by the patient. No language interpreter was used.  Back Pain Location:  Lumbar spine Quality: pressure. Radiates to:  L thigh (Left side of upper leg) Pain severity:  Severe Pain is:  Same all the time Onset quality:  Gradual Duration:  2 days Timing:  Constant Progression:  Unchanged Chronicity:  Chronic Context: physical stress (bending)   Context: not falling, not jumping from heights, not lifting heavy objects, not MVA and not twisting   Relieved by:  Heating pad, muscle relaxants and narcotics (oxycodone, mobic, and flexeril) Worsened by:  Bending Ineffective treatments:  None tried Associated symptoms: paresthesias (L thigh) and tingling (L thigh)   Associated symptoms: no abdominal pain, no bladder incontinence, no bowel incontinence, no chest pain, no dysuria, no fever, no headaches, no leg pain, no numbness, no perianal numbness and no weakness   Risk factors: no hx of cancer     Sheila [name] is a 49 y.o. female with a medical hx of chronic back pain, who presents to the Emergency department complaining of low to middle back pain gradually worsening over the last 2 days.  Pt states the pain is 10/10, constant, pressure/tightness, and it radiates down the side of her left leg intermittently. 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 She states that she has tried heating pad, oxycodone, mobic, and flexeril with mild relief for her symptoms. She states that she is having associated symptoms of tingling in her  L thigh. She denies fever, chills, CP, SOB, abdominal pain, n/v/d, constipation, hematuria, dysuria, flank pain, HA, numbness, weakness, cauda equina symptoms, and any other symptoms. Denies hx of CA or IV drug use. Pt has had kidney stones in the past but states this doesn't feel like that did.   Sheila [name] is the pt's PCP.    Past Medical History  Diagnosis Date  . Diverticulitis   . Bartholin cyst    Past Surgical History  Procedure Laterality Date  . Abdominal hysterectomy    . Appendectomy     History reviewed. No pertinent family history. History  Substance Use Topics  . Smoking status: Never Smoker   . Smokeless tobacco: Not on file  . Alcohol Use: No   OB History    No data available     Review of Systems  Constitutional: Negative for fever and chills.  Eyes: Negative for visual disturbance.  Respiratory: Negative for shortness of breath.   Cardiovascular: Negative for chest pain.  Gastrointestinal: Negative for nausea, vomiting, abdominal pain, diarrhea, constipation and bowel incontinence.  Genitourinary: Negative for bladder incontinence, dysuria, hematuria, flank pain, vaginal bleeding and vaginal discharge.  Musculoskeletal: Positive for back pain. Negative for myalgias, joint swelling and neck pain.  Skin: Negative for color change.  Allergic/Immunologic: Negative for immunocompromised state.  Neurological: Positive for tingling (L thigh) and paresthesias (L thigh). Negative for dizziness, weakness, light-headedness, numbness and headaches.  Psychiatric/Behavioral: Negative for confusion.    A complete 10 system  review of systems was obtained and all systems are negative except as noted in the HPI and PMH.    Allergies  Review of patient's allergies indicates no known allergies.  Home Medications   Prior to Admission medications   Medication Sig Start Date End Date Taking? Authorizing Provider  atenolol (TENORMIN) 50 MG tablet Take 50 mg by mouth  daily.    Historical Provider, MD  diazepam (VALIUM) 5 MG tablet Take 1 tablet (5 mg total) by mouth 2 (two) times daily. 06/13/14   Courtney Forcucci, PA-C  estradiol (ESTRACE) 1 MG tablet Take 1 mg by mouth daily.    Historical Provider, MD  famotidine (PEPCID) 20 MG tablet Take 1 tablet (20 mg total) by mouth 2 (two) times daily. 03/16/13   Ivonne AndrewPeter Dammen, PA-C  gabapentin (NEURONTIN) 600 MG tablet Take 600 mg by mouth 3 (three) times daily.    Historical Provider, MD  HYDROcodone-acetaminophen (NORCO) 10-325 MG per tablet Take 1 tablet by mouth every 4 (four) hours as needed for moderate pain.    Historical Provider, MD  HYDROcodone-acetaminophen (NORCO) 5-325 MG per tablet Take 1 tablet by mouth every 4 (four) hours as needed for pain. 03/16/13   Ivonne AndrewPeter Dammen, PA-C  meloxicam (MOBIC) 15 MG tablet Take 15 mg by mouth daily.    Historical Provider, MD  pantoprazole (PROTONIX) 40 MG tablet Take 40 mg by mouth daily.    Historical Provider, MD  predniSONE (DELTASONE) 10 MG tablet Day 1 take 6 pills Day 2 take 5 pills Day 3 take 4 pills Day 4 take 3 pills Day 5 take 2 pills Day 6 take 1 pill 06/13/14   Courtney Forcucci, PA-C  sucralfate (CARAFATE) 1 G tablet Take 1 tablet (1 g total) by mouth 4 (four) times daily. Patient taking differently: Take 1 g by mouth 4 (four) times daily as needed (flare ups.).  03/16/13   Ivonne AndrewPeter Dammen, PA-C   BP 112/67 mmHg  Pulse 77  Temp(Src) 98.5 F (36.9 C) (Oral)  Resp 16  Ht 5\' 4"  (1.626 m)  Wt 170 lb (77.111 kg)  BMI 29.17 kg/m2  SpO2 96%  Physical Exam  Constitutional: She is oriented to person, place, and time. Vital signs are normal. She appears well-developed and well-nourished.  Non-toxic appearance. No distress.  Afebrile, nontoxic, NAD  HENT:  Head: Normocephalic and atraumatic.  Mouth/Throat: Mucous membranes are normal.  Eyes: Conjunctivae and EOM are normal. Right eye exhibits no discharge. Left eye exhibits no discharge.  Neck: Normal range  of motion. Neck supple. No spinous process tenderness and no muscular tenderness present. No rigidity. Normal range of motion present.  FROM intact without spinous process or paraspinous muscle TTP, no bony stepoffs or deformities, no muscle spasms. No rigidity or meningeal signs.  Cardiovascular: Normal rate and intact distal pulses.   Pulmonary/Chest: Effort normal. No respiratory distress.  Abdominal: Soft. Normal appearance and bowel sounds are normal. She exhibits no distension. There is no tenderness. There is no rigidity, no rebound and no CVA tenderness.  Soft, NTND, +BS throughout, no r/g/r, and no CVA TTP  Musculoskeletal: Normal range of motion.       Lumbar back: She exhibits tenderness and spasm. She exhibits normal range of motion, no bony tenderness and no deformity.       Back:  Lumbar spine with FROM intact without spinous process TTP, no bony stepoffs or deformities, with mild diffused bilateral paraspinous muscle TTP with mild muscle spasms. Strength 5/5 in all extremities, sensation  grossly intact in all extremities, positive left sided SLR, negative right side SLR, gait steady and nonantalgic. No overlying skin changes.   Neurological: She is alert and oriented to person, place, and time. She has normal strength. No sensory deficit. Gait normal.  Gait steady and nonantalgic  Skin: Skin is warm, dry and intact. No rash noted.  Psychiatric: She has a normal mood and affect. Her behavior is normal.  Nursing note and vitals reviewed.   ED Course  Procedures (including critical care time) DIAGNOSTIC STUDIES: Oxygen Saturation is 96% on RA, nl by my interpretation.    COORDINATION OF CARE: 9:37 PM-Discussed treatment plan which includes warm compresses and f/u with PCP with pt at bedside and pt agreed to plan.   Labs Review Labs Reviewed - No data to display  Imaging Review No results found.   EKG Interpretation None      MDM   Final diagnoses:  Chronic back  pain  Sciatica, left    49 y.o. female here with chronic back pain, somewhat worsened due to her recent bending for her job. No red flag s/s of low back pain. No s/s of central cord compression or cauda equina. Lower extremities are neurovascularly intact and patient is ambulating without difficulty. Doubt need for imaging here, no midline tenderness. Has chronic pain meds at home, discussed that she needs to continue taking these and f/up with her PCP for ongoing management of her back pain, and that no meds will be given here since the narcotic policy does not allow for chronic narcotic refills. Pt agrees. No urinary complaints, doubt pyelo or kidney stones or other renal/urinary etiology for pain. Patient was counseled on back pain precautions and told to do activity as tolerated but do not lift, push, or pull heavy objects more than 10 pounds for the next week. Patient counseled to use ice or heat on back for no longer than 15 minutes every hour. Mild sciatica symptoms, will try prednisone pack.  Patient urged to follow-up with PCP if pain does not improve with treatment and rest or if pain becomes recurrent. Urged to return with worsening severe pain, loss of bowel or bladder control, trouble walking. The patient verbalizes understanding and agrees with the plan.   I personally performed the services described in this documentation, which was scribed in my presence. The recorded information has been reviewed and is accurate.  BP 112/67 mmHg  Pulse 77  Temp(Src) 98.5 F (36.9 C) (Oral)  Resp 16  Ht 5\' 4"  (1.626 m)  Wt 170 lb (77.111 kg)  BMI 29.17 kg/m2  SpO2 96%  Meds ordered this encounter  Medications  . predniSONE (DELTASONE) 20 MG tablet    Sig: 3 tabs po daily x 4 days    Dispense:  12 tablet    Refill:  0    Order Specific Question:  Supervising Provider    Answer:  Eber HongMILLER, BRIAN [[id number]]      Icess Bertoni Camprubi-Soms, PA-C 12/01/14 2157  Sheila BerkshireJoseph Zammit, MD 12/02/14 1320

## 2014-12-01 NOTE — ED Notes (Signed)
Pt c/o chronic back pain. Pt states pain has increased to an unbearable level. Pt states she can not got to work tonight because of the back pain.

## 2016-09-01 ENCOUNTER — Encounter (HOSPITAL_COMMUNITY): Payer: Self-pay | Admitting: *Deleted

## 2016-09-01 ENCOUNTER — Emergency Department (HOSPITAL_COMMUNITY)
Admission: EM | Admit: 2016-09-01 | Discharge: 2016-09-01 | Disposition: A | Discharge status: Home / Self Care | Payer: Medicaid Other | Attending: Emergency Medicine | Admitting: Emergency Medicine

## 2016-09-01 DIAGNOSIS — M67912 Unspecified disorder of synovium and tendon, left shoulder: Secondary | ICD-10-CM

## 2016-09-01 DIAGNOSIS — M25512 Pain in left shoulder: Secondary | ICD-10-CM

## 2016-09-01 DIAGNOSIS — M25812 Other specified joint disorders, left shoulder: Secondary | ICD-10-CM | POA: Insufficient documentation

## 2016-09-01 DIAGNOSIS — G8929 Other chronic pain: Secondary | ICD-10-CM

## 2016-09-01 HISTORY — DX: Endometriosis, unspecified: N80.9

## 2016-09-01 NOTE — ED Notes (Signed)
Brought patient back to room with family in tow 

## 2016-09-01 NOTE — ED Provider Notes (Signed)
[facility]-EMERGENCY DEPT Provider Note   CSN: [id number] Arrival date & time: 09/01/16  1026  By signing my name below, I, Sonum Patel, attest that this documentation has been prepared under the direction and in the presence of Molson Coors BrewingJessica Mitchell PA-C. Electronically Signed: Sonum Patel, Neurosurgeoncribe. 09/01/16. 1:02 PM.  History   Chief Complaint Chief Complaint  Patient presents with  . Shoulder Pain    The history is provided by the patient. No language interpreter was used.    HPI Comments: Sheila GasmanRebecca [name] is a 51 y.o. female who presents to the Emergency Department complaining of constant left shoulder and upper arm pain that has been ongoing for the past 7 months but worsened over the last 1 month. She states lately the pain is radiating down the left arm. She has associated paresthesia that wakes her from sleep. Her pain is worse with extending the left arm behind her as well as lifting the affected extremity. She denies known injury or trauma to the affected area.   She has a secondary complaints left sided, painful breast and axilla lumps that she noticed a few weeks ago. She had her last mammogram more than 3 years ago. She has not attempted to follow up for a breast exam due to lack of insurance.   Past Medical History:  Diagnosis Date  . Bartholin cyst   . Diverticulitis   . Endometriosis     There are no active problems to display for this patient.   Past Surgical History:  Procedure Laterality Date  . ABDOMINAL HYSTERECTOMY    . APPENDECTOMY    . FOOT SURGERY Bilateral   . LITHOTRIPSY      OB History    No data available       Home Medications    Prior to Admission medications   Medication Sig Start Date End Date Taking? Authorizing Provider  atenolol (TENORMIN) 50 MG tablet Take 50 mg by mouth daily.    Historical Provider, MD  diazepam (VALIUM) 5 MG tablet Take 1 tablet (5 mg total) by mouth 2 (two) times daily. 06/13/14   Courtney Forcucci, PA-C  estradiol  (ESTRACE) 1 MG tablet Take 1 mg by mouth daily.    Historical Provider, MD  famotidine (PEPCID) 20 MG tablet Take 1 tablet (20 mg total) by mouth 2 (two) times daily. 03/16/13   Ivonne AndrewPeter Dammen, PA-C  gabapentin (NEURONTIN) 600 MG tablet Take 600 mg by mouth 3 (three) times daily.    Historical Provider, MD  HYDROcodone-acetaminophen (NORCO) 10-325 MG per tablet Take 1 tablet by mouth every 4 (four) hours as needed for moderate pain.    Historical Provider, MD  HYDROcodone-acetaminophen (NORCO) 5-325 MG per tablet Take 1 tablet by mouth every 4 (four) hours as needed for pain. 03/16/13   Ivonne AndrewPeter Dammen, PA-C  meloxicam (MOBIC) 15 MG tablet Take 15 mg by mouth daily.    Historical Provider, MD  pantoprazole (PROTONIX) 40 MG tablet Take 40 mg by mouth daily.    Historical Provider, MD  predniSONE (DELTASONE) 10 MG tablet Day 1 take 6 pills Day 2 take 5 pills Day 3 take 4 pills Day 4 take 3 pills Day 5 take 2 pills Day 6 take 1 pill 06/13/14   Courtney Forcucci, PA-C  predniSONE (DELTASONE) 20 MG tablet 3 tabs po daily x 4 days 12/01/14   Mercedes Street, PA-C  sucralfate (CARAFATE) 1 G tablet Take 1 tablet (1 g total) by mouth 4 (four) times daily. Patient taking differently: Take 1  g by mouth 4 (four) times daily as needed (flare ups.).  03/16/13   [name], PA-C    Family History No family history on file.  Social History Social History  Substance Use Topics  . Smoking status: Never Smoker  . Smokeless tobacco: Never Used  . Alcohol use No     Allergies   Patient has no known allergies.   Review of Systems Review of Systems  Musculoskeletal: Positive for arthralgias and myalgias.       +left breast and axilla pain  Neurological: Negative for weakness and numbness.     Physical Exam Updated Vital Signs BP 111/66 (BP Location: Right Arm)   Pulse 68   Temp 98.4 F (36.9 C) (Oral)   Resp 18   Ht 5\' 4"  (1.626 m)   Wt 85.3 kg   SpO2 97%   BMI 32.27 kg/m   Physical Exam    Constitutional: She is oriented to person, place, and time. She appears well-developed and well-nourished.  Afebrile and non-toxic appearing. Sitting comfortably in chair and in no acute distress.   HENT:  Head: Normocephalic and atraumatic.  Eyes: Scleral icterus:    Cardiovascular: Normal rate, regular rhythm, normal heart sounds and intact distal pulses.  Exam reveals no gallop and no friction rub.   No murmur heard. Pulmonary/Chest: Effort normal and breath sounds normal. No respiratory distress. She has no wheezes. She has no rales.  Tender on left pectoral muscle.   Musculoskeletal: She exhibits no deformity.  +Hawkins test and +Neers sign  Reduced active ROM of the left shoulder. Reduced abduction and extension. Patient reports decreased sensation on left extremity but she has strong pulses, equal strength in BUE.   Neurological: She is alert and oriented to person, place, and time.  Good strength bilaterally.   Skin: Skin is warm and dry.  Fibrous tissue palpated along the left pectoral muscle. No hard mass appreciated. Patient is tender to palpation of pectoral muscle.  Psychiatric: She has a normal mood and affect.  Nursing note and vitals reviewed.    ED Treatments / Results  DIAGNOSTIC STUDIES: Oxygen Saturation is 98% on RA, normal by my interpretation.    COORDINATION OF CARE: 12:57 PM Discussed treatment plan with pt at bedside and pt agreed to plan.   Labs (all labs ordered are listed, but only abnormal results are displayed) Labs Reviewed - No data to display  EKG  EKG Interpretation None       Radiology No results found.  Procedures Procedures (including critical care time)  Medications Ordered in ED Medications - No data to display   Initial Impression / Assessment and Plan / ED Course  I have reviewed the triage vital signs and the nursing notes.  Pertinent labs & imaging results that were available during my care of the patient were reviewed  by me and considered in my medical decision making (see chart for details).     51 year old female presenting with chronic left shoulder pain that has been worsening over the last month. This has been ongoing for 7 months. On exam she has reduced range of motion abduction of the left shoulder and extension. Positive Neer's and Hawkins test. Consistent with rotator cuff involvement.No fall or trauma and no imaging indicated at this time. Patient had equal strength bilaterally and grips shoulder adduction and abduction and elbow flexion and extension. Strong distal pulses upper extremities bilaterally.  Provided patient with orthopedic referral information and explained that we cannot do an  MRI in the emergency department but they may choose to do so after seeing her in office. Also provided patient with women's health follow-up and recommended routine screening mammogram. Fibrous rolling tissue appreciated along the pectoral muscle but no hard mass.  Advised to continue with R.I.C.E protocol and shoulder exercises which were provided to patient. Patient already has medications at home and does not require any analgesia.  Discharge home with close orthopedic follow-up and women's health follow-up.  Discussed strict return precautions. Patient was advised to return to the emergency department if experiencing any new or worsening of symptoms. She understood instructions and agreed with discharge plan.   Final Clinical Impressions(s) / ED Diagnoses   Final diagnoses:  Chronic left shoulder pain  Rotator cuff disorder, left    New Prescriptions New Prescriptions   No medications on file   I personally performed the services described in this documentation, which was scribed in my presence. The recorded information has been reviewed and is accurate.    Georgiana Shore, PA-C 09/01/16 1400    Lorre Nick, MD 09/04/16 [phone]

## 2016-09-01 NOTE — ED Notes (Signed)
Declined W/C at D/C and was escorted to lobby by RN. 

## 2016-09-01 NOTE — ED Triage Notes (Signed)
PT reports the Lt shoulder pain has been present for 7months. Pt denies any injury . Pt also reports she has knots across her chest.

## 2016-09-01 NOTE — ED Triage Notes (Signed)
Pt c/o L shoulder pain x 7 months with no known injury pt reports pain worsening x 2-3 wks, pt reports also feeling "knots in her L breast", pt denies CP, SOB, n/v/d, A&O x4

## 2016-09-06 ENCOUNTER — Other Ambulatory Visit: Payer: Self-pay | Admitting: Gerontology

## 2016-09-06 DIAGNOSIS — M25512 Pain in left shoulder: Secondary | ICD-10-CM

## 2016-09-12 ENCOUNTER — Other Ambulatory Visit (HOSPITAL_COMMUNITY): Payer: Self-pay | Admitting: *Deleted

## 2016-09-12 ENCOUNTER — Ambulatory Visit
Admission: RE | Admit: 2016-09-12 | Discharge: 2016-09-12 | Disposition: A | Discharge status: Home / Self Care | Payer: No Typology Code available for payment source | Source: Ambulatory Visit | Attending: Gerontology | Admitting: Gerontology

## 2016-09-12 DIAGNOSIS — M25512 Pain in left shoulder: Secondary | ICD-10-CM

## 2016-09-12 DIAGNOSIS — N6320 Unspecified lump in the left breast, unspecified quadrant: Secondary | ICD-10-CM

## 2016-09-19 ENCOUNTER — Telehealth (HOSPITAL_COMMUNITY): Payer: Self-pay | Admitting: *Deleted

## 2016-09-19 NOTE — Telephone Encounter (Signed)
Patient called and left voicemail asking if she can be seen sooner in [facility]. Called patient back and let her know that the first available is not until a couple weeks after her appointment. I asked patient if the pain has increased or if there is any changes. Patient denied any changes with her breast. Patient complained of shoulder and chest pain. Patient has been seen in the ED for this concern and recommended to follow up with an Orthopaedic doctor. Told patient if she hasn't scheduled that appointment the importance of following up. Advised patient to call me if has any change in her breast symptoms. Patient verbalized understanding.

## 2016-10-04 ENCOUNTER — Ambulatory Visit (HOSPITAL_COMMUNITY)
Admission: RE | Admit: 2016-10-04 | Discharge: 2016-10-04 | Disposition: A | Discharge status: Home / Self Care | Payer: No Typology Code available for payment source | Source: Ambulatory Visit | Attending: Obstetrics and Gynecology | Admitting: Obstetrics and Gynecology

## 2016-10-04 ENCOUNTER — Ambulatory Visit
Admission: RE | Admit: 2016-10-04 | Discharge: 2016-10-04 | Disposition: A | Discharge status: Home / Self Care | Payer: No Typology Code available for payment source | Source: Ambulatory Visit | Attending: Obstetrics and Gynecology | Admitting: Obstetrics and Gynecology

## 2016-10-04 ENCOUNTER — Encounter (HOSPITAL_COMMUNITY): Payer: Self-pay

## 2016-10-04 VITALS — BP 148/100 | Temp 98.1°F | Ht 64.0 in | Wt 188.8 lb

## 2016-10-04 DIAGNOSIS — Z1239 Encounter for other screening for malignant neoplasm of breast: Secondary | ICD-10-CM

## 2016-10-04 DIAGNOSIS — N6320 Unspecified lump in the left breast, unspecified quadrant: Secondary | ICD-10-CM

## 2016-10-04 DIAGNOSIS — N644 Mastodynia: Secondary | ICD-10-CM

## 2016-10-04 HISTORY — DX: Essential (primary) hypertension: I10

## 2016-10-04 NOTE — Progress Notes (Signed)
Complaints of left breast lump x 2 weeks that is painful. Patient states the pain comes and goes. Patient rates the pain at a 8 out of 10.  Pap Smear:  Pap smear not completed today. Last Pap smear was in 2016 and normal per patient. Per patient has a history of an abnormal Pap smear 20 years ago that a repeat Pap smear was completed for follow up. Patient has a history of a hysterectomy 34 years ago due to fibroids and endometriosis. Patient no longer needs Pap smears due to her history of a hysterectomy for benign reasons per [facility] and ACOG guidelines. No Pap smear results are in EPIC.  Physical exam: Breasts Breasts symmetrical. No skin abnormalities bilateral breasts. No nipple retraction bilateral breasts. No nipple discharge bilateral breasts. No lymphadenopathy. No lumps palpated bilateral breasts. No lumps palpated in patients area of concern. Complaints of tenderness within the left breast at 12 o'clock on exam. [facility] and possible left breast ultrasound. Appointment scheduled for Thursday, October 04, 2016 at 1520.        Pelvic/Bimanual No Pap smear completed today since patient has a history of a hysterectomy for benign reasons. Pap smear not indicated per [facility] guidelines.   Smoking History: Patient has never smoked.  Patient Navigation: Patient education provided. Access to services provided for patient through [facility] program.   Colorectal Cancer Screening: Per patient had a colonoscopy completed 10 years ago. No complaints today. FIT Test given to patient to complete and return to [facility].

## 2016-10-04 NOTE — Patient Instructions (Signed)
Explained breast self awareness with Sheila [name]. Patient did not need a Pap smear today due to her history of a hysterectomy for benign reasons. Let patient know that she does not need any further Pap smears due to her history of a hysterectomy for benign reasons. [facility] and possible left breast ultrasound. Appointment scheduled for Thursday, October 04, 2016 at 1520.  Sheila GasmanRebecca Diguglielmo verbalized [name].  Bekki Tavenner, Kathaleen Maserhristine Poll, RN 2:59 PM

## 2016-10-05 ENCOUNTER — Encounter (HOSPITAL_COMMUNITY): Payer: Self-pay | Admitting: *Deleted

## 2017-01-01 ENCOUNTER — Encounter (HOSPITAL_COMMUNITY): Payer: Self-pay | Admitting: Emergency Medicine

## 2017-01-01 ENCOUNTER — Emergency Department (HOSPITAL_COMMUNITY)
Admission: EM | Admit: 2017-01-01 | Discharge: 2017-01-01 | Disposition: A | Discharge status: Home / Self Care | Payer: No Typology Code available for payment source | Attending: Emergency Medicine | Admitting: Emergency Medicine

## 2017-01-01 ENCOUNTER — Emergency Department (HOSPITAL_COMMUNITY): Payer: No Typology Code available for payment source

## 2017-01-01 DIAGNOSIS — Y939 Activity, unspecified: Secondary | ICD-10-CM | POA: Insufficient documentation

## 2017-01-01 DIAGNOSIS — S93402A Sprain of unspecified ligament of left ankle, initial encounter: Secondary | ICD-10-CM | POA: Insufficient documentation

## 2017-01-01 DIAGNOSIS — M545 Low back pain, unspecified: Secondary | ICD-10-CM

## 2017-01-01 DIAGNOSIS — Z79899 Other long term (current) drug therapy: Secondary | ICD-10-CM | POA: Insufficient documentation

## 2017-01-01 DIAGNOSIS — Y929 Unspecified place or not applicable: Secondary | ICD-10-CM | POA: Insufficient documentation

## 2017-01-01 DIAGNOSIS — W19XXXA Unspecified fall, initial encounter: Secondary | ICD-10-CM

## 2017-01-01 DIAGNOSIS — Y999 Unspecified external cause status: Secondary | ICD-10-CM | POA: Insufficient documentation

## 2017-01-01 DIAGNOSIS — W1839XA Other fall on same level, initial encounter: Secondary | ICD-10-CM | POA: Insufficient documentation

## 2017-01-01 DIAGNOSIS — G8929 Other chronic pain: Secondary | ICD-10-CM | POA: Insufficient documentation

## 2017-01-01 DIAGNOSIS — S92322A Displaced fracture of second metatarsal bone, left foot, initial encounter for closed fracture: Secondary | ICD-10-CM | POA: Insufficient documentation

## 2017-01-01 DIAGNOSIS — I10 Essential (primary) hypertension: Secondary | ICD-10-CM | POA: Insufficient documentation

## 2017-01-01 DIAGNOSIS — T07XXXA Unspecified multiple injuries, initial encounter: Secondary | ICD-10-CM

## 2017-01-01 DIAGNOSIS — Z23 Encounter for immunization: Secondary | ICD-10-CM | POA: Insufficient documentation

## 2017-01-01 MED ORDER — KETOROLAC TROMETHAMINE 30 MG/ML IJ SOLN
30.0000 mg | Freq: Once | INTRAMUSCULAR | Status: AC
  Administered 2017-01-01: 30 mg via INTRAMUSCULAR
  Filled 2017-01-01: qty 1

## 2017-01-01 MED ORDER — TETANUS-DIPHTH-ACELL PERTUSSIS 5-2.5-18.5 LF-MCG/0.5 IM SUSP
0.5000 mL | Freq: Once | INTRAMUSCULAR | Status: AC
  Administered 2017-01-01: 0.5 mL via INTRAMUSCULAR
  Filled 2017-01-01: qty 0.5

## 2017-01-01 NOTE — ED Notes (Signed)
Called ortho to bedside  

## 2017-01-01 NOTE — ED Provider Notes (Signed)
[facility]-EMERGENCY DEPT Provider Note   CSN: [id number] Arrival date & time: 01/01/17  1154  By signing my name below, I, Cynda AcresHailei Fulton, attest that this documentation has been prepared under the direction and in the presence of  [address], [employer]PA-C. Electronically Signed: Cynda AcresHailei Fulton, Scribe. 01/01/17. 12:23 PM.  History   Chief Complaint Chief Complaint  Patient presents with  . Abrasion  . Foot Injury    HPI Comments: Sheila [name] is a 51 y.o. female with a PMHx of chronic lower back pain and HTN, and a PSHx of remote b/l foot surgery, who presents to the Emergency Department by ambulance, complaining of sudden-onset, constant L foot/ankle pain and acute on chronic low back pain that began one hour prior to arrival s/p fall. Patient states she was stepping on a rotted board on her porch, when she fell though it, injuring her L foot/ankle and "jarring" her back. Denies that she hit her back during the fall. Patient describes her foot/ankle pain as 9/10 constant throbbing L foot pain with radiation to L ankle, worse with ambulating or applying pressure to the foot. Patient reports taking oxycodone with no relief (has this for chronic back pain). Patient reports associated L foot and ankle swelling, L foot tingling, L foot bruising, and small bruise with abrasion to R thigh. Last tetanus shot is unknown. Patient denies any head inj/LOC, abdominal pain, nausea, vomiting, incontinence of urine/stool, saddle anesthesia/cauda equina symptoms, numbness, focal weakness, or any other complaints at this time. Not on blood thinners.  The history is provided by the patient and medical records. No language interpreter was used.  Foot Injury   The incident occurred less than 1 hour ago. The incident occurred at home. The injury mechanism was a fall. The pain is present in the left foot and left ankle (lower back). The quality of the pain is described as throbbing. The pain is at a severity of 9/10. The  pain is moderate. The pain has been constant since onset. Associated symptoms include inability to bear weight and tingling. Pertinent negatives include no numbness, no loss of motion, no muscle weakness and no loss of sensation. She reports no foreign bodies present. The symptoms are aggravated by bearing weight. Treatments tried: oxycodone. The treatment provided no relief.    Past Medical History:  Diagnosis Date  . Bartholin cyst   . Diverticulitis   . Endometriosis   . Hypertension     There are no active problems to display for this patient.   Past Surgical History:  Procedure Laterality Date  . ABDOMINAL HYSTERECTOMY    . APPENDECTOMY    . FOOT SURGERY Bilateral   . LITHOTRIPSY      OB History    Gravida Para Term Preterm AB Living   3 2 2   1      SAB TAB Ectopic Multiple Live Births     1     2       Home Medications    Prior to Admission medications   Medication Sig Start Date End Date Taking? Authorizing Provider  atenolol (TENORMIN) 50 MG tablet Take 50 mg by mouth daily.    [provider]  cetirizine (ZYRTEC) 10 MG tablet Take 10 mg by mouth daily.    [provider]  cholecalciferol (VITAMIN D) 1000 units tablet Take 1,000 Units by mouth daily.    [provider]  citalopram (CELEXA) 40 MG tablet Take 40 mg by mouth daily.    [provider]  diazepam (VALIUM) 5 MG tablet Take 1 tablet (5 mg total) by mouth 2 (two) times daily. Patient not taking: Reported on 10/04/2016 06/13/14   Forcucci, Toni Amend, PA-C  estradiol (ESTRACE) 1 MG tablet Take 1 mg by mouth daily.    [provider]  famotidine (PEPCID) 20 MG tablet Take 1 tablet (20 mg total) by mouth 2 (two) times daily. Patient not taking: Reported on 10/04/2016 03/16/13   [name], PA-C  gabapentin (NEURONTIN) 600 MG tablet Take 600 mg by mouth 3 (three) times daily.    [provider]  HYDROcodone-acetaminophen (NORCO) 10-325 MG per tablet Take 1  tablet by mouth every 4 (four) hours as needed for moderate pain.    [provider]  HYDROcodone-acetaminophen (NORCO) 5-325 MG per tablet Take 1 tablet by mouth every 4 (four) hours as needed for pain. Patient not taking: Reported on 10/04/2016 03/16/13   [name], PA-C  lisinopril (PRINIVIL,ZESTRIL) 20 MG tablet Take 20 mg by mouth daily.    [provider]  meloxicam (MOBIC) 15 MG tablet Take 15 mg by mouth daily.    [provider]  omeprazole (PRILOSEC) 20 MG capsule Take 20 mg by mouth daily.    [provider]  pantoprazole (PROTONIX) 40 MG tablet Take 40 mg by mouth daily.    [provider]  predniSONE (DELTASONE) 10 MG tablet Day 1 take 6 pills Day 2 take 5 pills Day 3 take 4 pills Day 4 take 3 pills Day 5 take 2 pills Day 6 take 1 pill Patient not taking: Reported on 10/04/2016 06/13/14   Forcucci, Toni Amend, PA-C  predniSONE (DELTASONE) 20 MG tablet 3 tabs po daily x 4 days Patient not taking: Reported on 10/04/2016 12/01/14   Ahmad Vanwey, [place], PA-C  sucralfate (CARAFATE) 1 G tablet Take 1 tablet (1 g total) by mouth 4 (four) times daily. Patient not taking: Reported on 10/04/2016 03/16/13   [name], PA-C    Family History Family History  Problem Relation Age of Onset  . Hypertension Mother   . Diabetes Mother   . Cancer Mother        uterine  . Hypertension Father   . Diabetes Father   . Cancer Father        liver and pancreatic    Social History Social History  Substance Use Topics  . Smoking status: Never Smoker  . Smokeless tobacco: Never Used  . Alcohol use No     Allergies   Patient has no known allergies.   Review of Systems Review of Systems  HENT: Negative for facial swelling (no head inj).   Gastrointestinal: Negative for abdominal pain, nausea and vomiting.  Genitourinary: Negative for difficulty urinating (no incontinence).  Musculoskeletal: Positive for arthralgias (left foot and ankle), back pain  (lower, chronic) and joint swelling (left foot and ankle). Negative for gait problem and myalgias.  Skin: Positive for color change (bruising L foot) and wound (abrasion R thigh).  Allergic/Immunologic: Negative for immunocompromised state.  Neurological: Positive for tingling. Negative for syncope, weakness and numbness.       +tingling L foot  Hematological: Does not bruise/bleed easily.  Psychiatric/Behavioral: Negative for confusion.    Physical Exam Updated Vital Signs BP (!) 135/96 (BP Location: Right Arm)   Pulse 71   Temp 98.6 F (37 C) (Oral)   Resp 20   Ht 5\' 4"  (1.626 m)   Wt 200 lb (90.7 kg)   SpO2 96%   BMI 34.33  kg/m   Physical Exam  Constitutional: She is oriented to person, place, and time. Vital signs are normal. She appears well-developed and well-nourished.  Non-toxic appearance. No distress.  Afebrile, nontoxic, NAD  HENT:  Head: Normocephalic and atraumatic.  Mouth/Throat: Mucous membranes are normal.  Eyes: Conjunctivae and EOM are normal. Right eye exhibits no discharge. Left eye exhibits no discharge.  Neck: Normal range of motion. Neck supple.  Cardiovascular: Normal rate and intact distal pulses.   Pulmonary/Chest: Effort normal. No respiratory distress.  Abdominal: Normal appearance. She exhibits no distension.  Musculoskeletal: Normal range of motion.       Lumbar back: She exhibits tenderness and spasm. She exhibits normal range of motion, no bony tenderness, no swelling, no deformity and no laceration.       Left foot: There is tenderness, bony tenderness and swelling. There is normal range of motion, normal capillary refill, no crepitus, no deformity and no laceration.  Left foot and ankle with mild swelling, mostly to the lateral malleolus and the distal metatarsal area, bruising across the forefoot, no abrasions or wounds, FROM intact in all digits and the ankle, no crepitus or deformity.  No tenderness of the L calf or remainder of both lower  extremities. Small bruise and abrasion to R thigh, but no focal tenderness to this area. Lumbar spine with FROM intact without spinous process TTP, no bony stepoffs or deformities, with mild bilateral paraspinous muscle TTP and muscle spasms. No overlying skin changes. Strength and sensation grossly intact in all extremities. Distal pulses intact.  Neurological: She is alert and oriented to person, place, and time. She has normal strength. No sensory deficit.  Skin: Skin is warm and dry. Abrasion and ecchymosis noted. No rash noted.  Bruising of left foot as mentioned above. Small abrasion and bruise of the right thigh.   Psychiatric: She has a normal mood and affect. Her behavior is normal.  Nursing note and vitals reviewed.    ED Treatments / Results  DIAGNOSTIC STUDIES: Oxygen Saturation is 96% on RA, adequate by my interpretation.    COORDINATION OF CARE: 12:22 PM Discussed treatment plan with pt at bedside and pt agreed to plan, which includes imaging and Toradol.  Labs (all labs ordered are listed, but only abnormal results are displayed) Labs Reviewed - No data to display  EKG  EKG Interpretation None       Radiology Dg Ankle Complete Left  Result Date: 01/01/2017 CLINICAL DATA:  Fall .  Severe pain. EXAM: LEFT ANKLE COMPLETE - 3+ VIEW COMPARISON:  No recent prior . FINDINGS: Diffuse soft tissue swelling. Corticated bony density noted arising from the lateral malleolus, most likely old fracture fragment. No evidence of acute fracture identified. Surgical screw noted in the base of the left first metatarsal. IMPRESSION: 1. Diffuse soft tissue swelling. 2. Old fracture distal tip lateral malleolus. No acute fracture identified. 3. Surgical screw noted base of the left first metatarsal . Electronically Signed   By: Maisie Fus  Register   On: 01/01/2017 12:57   Dg Foot Complete Left  Result Date: 01/01/2017 CLINICAL DATA:  Fall EXAM: LEFT FOOT - COMPLETE 3+ VIEW COMPARISON:  None.  FINDINGS: There is a displaced transverse fractures through the mid second metatarsal. There is a metal screw in the proximal first metatarsal. Osteopenia. No other obvious fracture or dislocation. IMPRESSION: Acute fracture through the second metatarsal. Electronically Signed   By: Jolaine Click M.D.   On: 01/01/2017 12:56    Procedures Procedures (including critical  care time)  Medications Ordered in ED Medications  Tdap (BOOSTRIX) injection 0.5 mL (not administered)  ketorolac (TORADOL) 30 MG/ML injection 30 mg (not administered)     Initial Impression / Assessment and Plan / ED Course  I have reviewed the triage vital signs and the nursing notes.  Pertinent labs & imaging results that were available during my care of the patient were reviewed by me and considered in my medical decision making (see chart for details).     51 y.o. female here with fall through rotted board in her porch, c/o L foot/anke pain and swelling. Tenderness to lateral malleolus and across forefoot, mild swelling in these areas, with bruising to the forefoot. NVI with soft compartments. C/o chronic back pain as well, did not hit the area, on exam, no focal midline tenderness, mild b/l paraspinous tenderness, likely just jarred her back during the fall. Also with abrasion and bruise to R thigh, but no focal tenderness to this area. Will update Tdap, and give toradol IM (pt took oxycodone just PTA). Will get xray of L foot and ankle, doubt need for other emergent imaging at this time. Will reassess shortly  1:05 PM Xray of ankle negative; foot xray shows displaced transverse fx of 2nd metatarsal. Will treat with CAM walker and crutches for all weight bearing activities. Advised RICE, tylenol/motrin and/or home pain meds as needed for pain. F/up with podiatrist in 5-7 days for recheck of symptoms and ongoing management of her foot fracture; also advised f/up with PCP in 1wk for recheck of her chronic low back pain. I  explained the diagnosis and have given explicit precautions to return to the ER including for any other new or worsening symptoms. The patient understands and accepts the medical plan as it's been dictated and I have answered their questions. Discharge instructions concerning home care and prescriptions have been given. The patient is STABLE and is discharged to home in good condition.   I personally performed the services described in this documentation, which was scribed in my presence. The recorded information has been reviewed and is accurate.   Final Clinical Impressions(s) / ED Diagnoses   Final diagnoses:  Displaced fracture of second metatarsal bone, left foot, initial encounter for closed fracture  Abrasions of multiple sites  Sprain of left ankle, unspecified ligament, initial encounter  Fall, initial encounter  Chronic bilateral low back pain without sciatica    New Prescriptions New Prescriptions   No medications on [address] [place], [place] 01/01/17 1305    Maia Plan, MD 01/01/17 1910

## 2017-01-01 NOTE — ED Notes (Signed)
Bed: WTR5 Expected date:  Expected time:  Means of arrival:  Comments: EMS-fall 

## 2017-01-01 NOTE — ED Triage Notes (Signed)
Patient had some rotten boards on house and step on one bad board. She fell through. Patient is complaining of left foot pain. Patient has a hx of chronic of lower back. Patient states she did not hit her lower back. Patient has abrasions on left foot, right thigh, and left leg.

## 2017-01-01 NOTE — Discharge Instructions (Signed)
Wear CAM walker during all weight bearing activities. Use crutches for all weight bearing activities. Ice and elevate foot and ankle throughout the day, using ice pack for no more than 20 minutes every hour.  Alternate between tylenol and motrin for pain relief, and use your home oxycodone as needed for additional pain relief. Do not drive or operate machinery with pain medication use. Follow up with the podiatrist in 5-7 days for recheck and ongoing management of your foot/ankle injury. Follow up with your regular doctor in 1 week for recheck of your back pain. Return to the ER for changes or worsening symptoms.

## 2017-01-02 ENCOUNTER — Other Ambulatory Visit: Payer: Self-pay

## 2017-01-02 ENCOUNTER — Ambulatory Visit (INDEPENDENT_AMBULATORY_CARE_PROVIDER_SITE_OTHER): Payer: Self-pay | Admitting: Podiatry

## 2017-01-02 DIAGNOSIS — S92322A Displaced fracture of second metatarsal bone, left foot, initial encounter for closed fracture: Secondary | ICD-10-CM

## 2017-01-02 MED ORDER — OXYCODONE-ACETAMINOPHEN 10-325 MG PO TABS: 1.0000 | ORAL_TABLET | Freq: Three times a day (TID) | ORAL | 0 refills | Status: AC | PRN

## 2017-01-02 MED ORDER — IBUPROFEN 600 MG PO TABS: 600.0000 mg | ORAL_TABLET | Freq: Three times a day (TID) | ORAL | 1 refills | Status: AC | PRN

## 2017-01-02 MED ORDER — IBUPROFEN 600 MG PO TABS: 600.0000 mg | ORAL_TABLET | Freq: Three times a day (TID) | ORAL | 1 refills | Status: DC | PRN

## 2017-01-02 MED ORDER — OXYCODONE-ACETAMINOPHEN 5-325 MG PO TABS
1.0000 | ORAL_TABLET | Freq: Three times a day (TID) | ORAL | 0 refills | Status: DC | PRN
Start: 2017-01-02 — End: 2017-01-02

## 2017-01-02 NOTE — Progress Notes (Signed)
   HPI: 51 year old female presents today for evaluation of trauma to the left foot. Patient states that she fell through dry rot area of her back patio that she rents. She states that she complained about the safety issues multiple times to her landlord however there was no repairs performed in a timely manner. She states that she fell through the dry rot area of the back patio and sustained significant trauma to the left foot as well as ecchymosis and superficial abrasions diffusely throughout the body. Patient presented to the emergency department and was referred here to the office for follow-up treatment and evaluation. Patient was diagnosed with a metatarsal fracture in the emergency department based on radiographic exam.   Physical Exam: General: The patient is alert and oriented x3 in no acute distress.  Dermatology: Skin is warm, dry and supple bilateral lower extremities. Negative for open lesions or macerations.  Vascular: Palpable pedal pulses bilaterally. There is a significant amount of edema and ecchymosis noted diffusely throughout the left foot.  Neurological: Epicritic and protective threshold grossly intact bilaterally.   Musculoskeletal Exam: Severe pain on palpation noted diffusely throughout the left foot more specifically overlying the second metatarsal diaphysis.  ER Radiographic Exam:  FINDINGS: There is a displaced transverse fractures through the mid second metatarsal. There is a metal screw in the proximal first metatarsal. Osteopenia. No other obvious fracture or dislocation.  IMPRESSION: Acute fracture through the second metatarsal.  Assessment: 1. Closed, displaced, transverse metatarsal fracture second metatarsal left foot.   Plan of Care:  1. Patient was evaluated. X-rays taken in the emergency department were reviewed today 2. Patient requires surgical ORIF of the second metatarsal to allow for optimal healing and realignment. 3. Today authorization for  surgery was initiated. All details and possible consultations of the procedure were explained. No guarantees were expressed or implied. All patient questions were answered. 4. Surgery will consist of open reduction internal fixation second metatarsal fracture left foot. 5. Patient currently has no health insurance. Today pricing and information regarding the surgery was provided for the patient.  6. Return to clinic 1 week postop   Sheila [name], Sheila [name] [facility]  Dr. Felecia ShellingBrent M. Demara Lover, Sheila [name]    [address]                                        [place], [place] [id number]                Office [phone]  Fax [phone]

## 2017-01-09 ENCOUNTER — Telehealth: Payer: Self-pay | Admitting: *Deleted

## 2017-01-09 NOTE — Telephone Encounter (Signed)
"  I need to know how much I'm going to have to pay for anesthesia."  You will need to call the surgical center for that information.  I am not sure of who they use.  Their phone number is [phone].    "I called the anesthesia place and they are asking me how long my procedure is going to take."  Your procedure will be 45 minutes.  "I called anesthesia and they mentioned a leg block to me.  Is Dr. Logan BoresEvans going to do a leg block?"  He normally does not do leg blocks but if you request it he will probably be okay doing it.  "Well I would like to have one.  I had one when I had my arm surgery and it worked wonders."  I will let Dr. Logan BoresEvans know.

## 2017-01-14 ENCOUNTER — Telehealth: Payer: Self-pay | Admitting: *Deleted

## 2017-01-14 NOTE — Telephone Encounter (Signed)
I attempted to call patient to see if she has insurance.  She's scheduled for surgery on 01/24/2017.  We do not have any insurance information.  She has a bill pending from her first visit here.

## 2017-01-22 NOTE — Telephone Encounter (Signed)
"  I was wondering if you have spoken to the patient.  She is scheduled for surgery on Thursday with Dr. Logan BoresEvans.  We have been trying to contact her so we can make her aware of what she will have to pay up front.  She had told us previously that she was filing a suit on her rental owner because she fell through the deck and broke her foot.  Do you all have any information regarding this incident?"  We were informed of the accident.  I have been trying to call her as well regarding the self pay status.  I have not had any success.  I will try and call her again today to see if still plans on having her surgery.  I will get her to call you if I hear from her.  I called and left the patient a message to call me back.  I need to see if she is planning to have surgery and to get information about payment arrangements.

## 2017-01-23 NOTE — Telephone Encounter (Signed)
I want to cancel my surgery for tomorrow.  I have to find a way to pay for it.  I'm thinking about applying for one of those credit cards that have no interest for 6 months."  Okay, I'll get it canceled and let Dr. Logan BoresEvans know.  I called the surgical center and informed Renee.  She stated the patient had just called there and told them.

## 2017-01-28 ENCOUNTER — Encounter: Payer: Self-pay | Admitting: Podiatry

## 2017-01-31 ENCOUNTER — Telehealth: Payer: Self-pay | Admitting: *Deleted

## 2017-01-31 NOTE — Telephone Encounter (Signed)
"  I'm a patient of Dr. Logan BoresEvans.  I was [facility] to have [facility] but I don't have insurance so I had to cancel.  Someone mentioned to me that I might be able to get it done at the hospital.  What's the name of the hospital he does surgeries at?  [facility] surgeries at [facility].  "Do you know who I was talk to get it arranged for hardship?"  No I do not know who you would contact.  "I[facility] give [facility] a call at [facility]."

## 2017-02-04 ENCOUNTER — Encounter: Payer: Self-pay | Admitting: Podiatry

## 2017-02-06 ENCOUNTER — Encounter (HOSPITAL_COMMUNITY): Payer: Self-pay | Admitting: Emergency Medicine

## 2017-02-06 ENCOUNTER — Emergency Department (HOSPITAL_COMMUNITY): Payer: Self-pay

## 2017-02-06 DIAGNOSIS — W138XXD Fall from, out of or through other building or structure, subsequent encounter: Secondary | ICD-10-CM | POA: Insufficient documentation

## 2017-02-06 DIAGNOSIS — S92322P Displaced fracture of second metatarsal bone, left foot, subsequent encounter for fracture with malunion: Secondary | ICD-10-CM | POA: Insufficient documentation

## 2017-02-06 DIAGNOSIS — I10 Essential (primary) hypertension: Secondary | ICD-10-CM | POA: Insufficient documentation

## 2017-02-06 DIAGNOSIS — Z79899 Other long term (current) drug therapy: Secondary | ICD-10-CM | POA: Insufficient documentation

## 2017-02-06 NOTE — ED Triage Notes (Signed)
Pt comes in after breaking her foot on the 5th of July after falling through a deck.  Pt states she was seen here and also saw a foot specialist and neither placed a cast.  States the foot specialist wanted to do surgery but she does not have insurance.

## 2017-02-07 ENCOUNTER — Emergency Department (HOSPITAL_COMMUNITY)
Admission: EM | Admit: 2017-02-07 | Discharge: 2017-02-07 | Disposition: A | Discharge status: Home / Self Care | Payer: Self-pay | Attending: Emergency Medicine | Admitting: Emergency Medicine

## 2017-02-07 ENCOUNTER — Ambulatory Visit (HOSPITAL_COMMUNITY): Admission: RE | Admit: 2017-02-07 | Payer: Self-pay | Source: Ambulatory Visit

## 2017-02-07 DIAGNOSIS — S92322P Displaced fracture of second metatarsal bone, left foot, subsequent encounter for fracture with malunion: Secondary | ICD-10-CM

## 2017-02-07 MED ORDER — ENOXAPARIN SODIUM 150 MG/ML ~~LOC~~ SOLN
1.5000 mg/kg | Freq: Once | SUBCUTANEOUS | Status: AC
  Administered 2017-02-07: 135 mg via SUBCUTANEOUS
  Filled 2017-02-07: qty 0.91

## 2017-02-07 NOTE — Discharge Instructions (Signed)
Avoid from putting weight on your left leg. Use crutches to help prevent weightbearing. We do recommend 600 mg ibuprofen every 6-8 hours for swelling and pain. Ice your foot 3-4 times per day. Keep your foot wrapped with an Ace wrap. You may also use your boot if desired. Follow up with Dr. Logan BoresEvans to discuss further management. You may return for new or concerning symptoms.

## 2017-02-07 NOTE — ED Provider Notes (Signed)
[facility]-EMERGENCY DEPT Provider Note   CSN: [id number] Arrival date & time: 02/06/17  2158    History   Chief Complaint Chief Complaint  Patient presents with  . Foot Injury    HPI Sheila [name] is a 51 y.o. female.  51 year old female presents to the emergency department for left foot pain. She has a history of second metatarsal fracture after a fall through a deck over one month ago. Patient subsequently seen by podiatry and recommended for operative repair. She has refrained from putting any weight on her left foot as instructed. She is no longer using a CAM walker, but has been applying an Ace wrap. Patient reports seeing a new right wrist recently after she was unable to afford surgery with [name] given lack of insurance. Patient states this podiatrist recommended discontinuation of NSAIDs and told her she no longer required surgery and that her fracture would "heal on its own". She presents today for persistent pain and worsening swelling in her left foot. Patient also notes some mild erythema. She has not had any sensory changes. No associated fevers. No repeat injury.      Past Medical History:  Diagnosis Date  . Bartholin cyst   . Diverticulitis   . Endometriosis   . Hypertension     There are no active problems to display for this patient.   Past Surgical History:  Procedure Laterality Date  . ABDOMINAL HYSTERECTOMY    . APPENDECTOMY    . FOOT SURGERY Bilateral   . LITHOTRIPSY      OB History    Gravida Para Term Preterm AB Living   3 2 2   1      SAB TAB Ectopic Multiple Live Births     1     2       Home Medications    Prior to Admission medications   Medication Sig Start Date End Date Taking? Authorizing Provider  atenolol (TENORMIN) 50 MG tablet Take 50 mg by mouth daily.    [provider]  cetirizine (ZYRTEC) 10 MG tablet Take 10 mg by mouth daily.    [provider]  cholecalciferol (VITAMIN D) 1000 units tablet Take  1,000 Units by mouth daily.    [provider]  citalopram (CELEXA) 40 MG tablet Take 40 mg by mouth daily.    [provider]  diazepam (VALIUM) 5 MG tablet Take 1 tablet (5 mg total) by mouth 2 (two) times daily. Patient not taking: Reported on 10/04/2016 06/13/14   Forcucci, Toni Amend, PA-C  estradiol (ESTRACE) 1 MG tablet Take 1 mg by mouth daily.    [provider]  gabapentin (NEURONTIN) 600 MG tablet Take 600 mg by mouth 3 (three) times daily.    [provider]  ibuprofen (ADVIL,MOTRIN) 600 MG tablet Take 1 tablet (600 mg total) by mouth every 8 (eight) hours as needed. 01/02/17   [name], DPM  lisinopril (PRINIVIL,ZESTRIL) 20 MG tablet Take 20 mg by mouth daily.    [provider]  meloxicam (MOBIC) 15 MG tablet Take 15 mg by mouth daily.    [provider]  omeprazole (PRILOSEC) 20 MG capsule Take 20 mg by mouth daily.    [provider]  Oxycodone HCl 10 MG TABS TK 1 T PO  FOUR TIMES DAILY PRN FOR PAIN 12/31/16   [provider]  oxyCODONE-acetaminophen (PERCOCET) 10-325 MG tablet Take 1 tablet by mouth every 8 (eight) hours as needed for pain. 01/02/17  Felecia ShellingEvans, [name], DPM  sucralfate (CARAFATE) 1 G tablet Take 1 tablet (1 g total) by mouth 4 (four) times daily. Patient not taking: Reported on 10/04/2016 03/16/13   [name], [name], PA-C    Family History Family History  Problem Relation Age of Onset  . Hypertension Mother   . Diabetes Mother   . Cancer Mother        uterine  . Hypertension Father   . Diabetes Father   . Cancer Father        liver and pancreatic    Social History Social History  Substance Use Topics  . Smoking status: Never Smoker  . Smokeless tobacco: Never Used  . Alcohol use No     Allergies   Patient has no known allergies.   Review of Systems Review of Systems Ten systems reviewed and are negative for acute change, except as noted in the HPI.    Physical Exam Updated  Vital Signs BP (!) 156/87   Pulse 70   Temp 98 F (36.7 C) (Oral)   Resp 20   Ht 5\' 4"  (1.626 m)   Wt 90.7 kg (200 lb)   SpO2 100%   BMI 34.33 kg/m   Physical Exam  Constitutional: She is oriented to person, place, and time. She appears well-developed and well-nourished. No distress.  Nontoxic and in NAD  HENT:  Head: Normocephalic and atraumatic.  Eyes: Conjunctivae and EOM are normal. No scleral icterus.  Neck: Normal range of motion.  Cardiovascular: Normal rate, regular rhythm and intact distal pulses.   DP pulse 2+ in BLE  Pulmonary/Chest: Effort normal. No respiratory distress.  Respirations even and unlabored  Musculoskeletal:       Left ankle: She exhibits normal range of motion. No tenderness.       Left foot: There is tenderness and swelling.       Feet:  TTP corresponds to known fx of the left 2nd metatarsal. Compartments of the foot are soft. There is edema which is slightly pitting. Faint erythema to the medial distal left lower extremity. No heat to touch or red linear streaking.  Neurological: She is alert and oriented to person, place, and time. She exhibits normal muscle tone. Coordination normal.  Sensation to light touch intact in the left lower extremity. Patient able to wiggle all toes.  Skin: Skin is warm and dry. No rash noted. She is not diaphoretic. No erythema. No pallor.  Psychiatric: She has a normal mood and affect. Her behavior is normal.  Nursing note and vitals reviewed.    ED Treatments / Results  Labs (all labs ordered are listed, but only abnormal results are displayed) Labs Reviewed  I-STAT CHEM 8, ED    EKG  EKG Interpretation None       Radiology Dg Foot Complete Left  Result Date: 02/06/2017 CLINICAL DATA:  Fall EXAM: LEFT FOOT - COMPLETE 3+ VIEW COMPARISON:  01/01/2017 FINDINGS: Status post screw fixation base of first metatarsal as before. Fracture involving the midshaft of the second metacarpal with increased displacement  compared to prior. Approximately 1 bone with of lateral displacement of distal fracture fragment and about 5 mm of overriding. No subluxation seen. Possible additional lucency in the medial navicular. IMPRESSION: 1. Increased displacement of the second metatarsal fracture without substantial interval healing 2. Possible linear lucency/nondisplaced fracture involving the medial navicular, correlate clinically for focal tenderness to this region. Electronically Signed   By: Jasmine PangKim  Fujinaga M.D.   On: 02/06/2017 23:29  Procedures Procedures (including critical care time)  Medications Ordered in ED Medications  enoxaparin (LOVENOX) injection 135 mg (135 mg Subcutaneous Given 02/07/17 0311)     Initial Impression / Assessment and Plan / ED Course  I have reviewed the triage vital signs and the nursing notes.  Pertinent labs & imaging results that were available during my care of the patient were reviewed by me and considered in my medical decision making (see chart for details).     50 year old female with a history of metatarsal fracture presents for persistent pain and worsening swelling. Patient neurovascularly intact. Compartments soft. X-ray shows worsening displacement, though patient denies any weightbearing since initial evaluation.  Have continued to recommend stabilization with a cam walker or firm wrapping with Ace wrap. Patient has crutches to use for nonweightbearing. Given swelling and prolonged immobilization of the lower extremity, unable to rule out DVT as cause of worsening edema. Patient treated with a shot of Lovenox will refer for venous duplex in the morning. Patient to continue follow-up with [name]. Return precautions given at discharge. Patient agreeable to plan with no unaddressed concerns.   Final Clinical Impressions(s) / ED Diagnoses   Final diagnoses:  Closed displaced fracture of second metatarsal bone of left foot with malunion, subsequent encounter    New  Prescriptions Discharge Medication List as of 02/07/2017  3:31 AM       Antony Madura, PA-C 02/07/17 0547    Derwood Kaplan, MD 02/07/17 [phone]

## 2017-02-08 ENCOUNTER — Encounter (HOSPITAL_COMMUNITY): Payer: Self-pay | Admitting: Emergency Medicine

## 2017-02-08 ENCOUNTER — Emergency Department (HOSPITAL_COMMUNITY)
Admission: EM | Admit: 2017-02-08 | Discharge: 2017-02-08 | Disposition: A | Discharge status: Home / Self Care | Payer: Self-pay | Attending: Emergency Medicine | Admitting: Emergency Medicine

## 2017-02-08 ENCOUNTER — Emergency Department (HOSPITAL_BASED_OUTPATIENT_CLINIC_OR_DEPARTMENT_OTHER)
Admit: 2017-02-08 | Discharge: 2017-02-08 | Disposition: A | Discharge status: Home / Self Care | Payer: Self-pay | Attending: Emergency Medicine | Admitting: Emergency Medicine

## 2017-02-08 DIAGNOSIS — I10 Essential (primary) hypertension: Secondary | ICD-10-CM | POA: Insufficient documentation

## 2017-02-08 DIAGNOSIS — K921 Melena: Secondary | ICD-10-CM

## 2017-02-08 DIAGNOSIS — R2242 Localized swelling, mass and lump, left lower limb: Secondary | ICD-10-CM | POA: Insufficient documentation

## 2017-02-08 DIAGNOSIS — Z79899 Other long term (current) drug therapy: Secondary | ICD-10-CM | POA: Insufficient documentation

## 2017-02-08 DIAGNOSIS — M79609 Pain in unspecified limb: Secondary | ICD-10-CM

## 2017-02-08 DIAGNOSIS — M7989 Other specified soft tissue disorders: Secondary | ICD-10-CM

## 2017-02-08 LAB — CBC
HCT: 41.4 % (ref 36.0–46.0)
Hemoglobin: 13.9 g/dL (ref 12.0–15.0)
MCH: 29 pg (ref 26.0–34.0)
MCHC: 33.6 g/dL (ref 30.0–36.0)
MCV: 86.3 fL (ref 78.0–100.0)
PLATELETS: 304 10*3/uL (ref 150–400)
RBC: 4.8 MIL/uL (ref 3.87–5.11)
RDW: 13.4 % (ref 11.5–15.5)
WBC: 7 10*3/uL (ref 4.0–10.5)

## 2017-02-08 LAB — BASIC METABOLIC PANEL
Anion gap: 9 (ref 5–15)
BUN: 7 mg/dL (ref 6–20)
CO2: 25 mmol/L (ref 22–32)
Calcium: 9.1 mg/dL (ref 8.9–10.3)
Chloride: 103 mmol/L (ref 101–111)
Creatinine, Ser: 0.62 mg/dL (ref 0.44–1.00)
GFR calc Af Amer: >60 mL/min (ref >60)
Glucose, Bld: 81 mg/dL (ref 65–99)
POTASSIUM: 4 mmol/L (ref 3.5–5.1)
Sodium: 137 mmol/L (ref 135–145)

## 2017-02-08 LAB — PROTIME-INR
INR: 1.03
Prothrombin Time: 13.5 seconds (ref 11.4–15.2)

## 2017-02-08 LAB — POC OCCULT BLOOD, ED: Fecal Occult Bld: POSITIVE — AB

## 2017-02-08 MED ORDER — OXYCODONE-ACETAMINOPHEN 5-325 MG PO TABS
2.0000 | ORAL_TABLET | Freq: Once | ORAL | Status: AC
  Administered 2017-02-08: 2 via ORAL
  Filled 2017-02-08: qty 2

## 2017-02-08 MED ORDER — SODIUM CHLORIDE 0.9 % IV BOLUS (SEPSIS)
500.0000 mL | Freq: Once | INTRAVENOUS | Status: AC
  Administered 2017-02-08: 500 mL via INTRAVENOUS

## 2017-02-08 MED ORDER — ONDANSETRON HCL 4 MG/2ML IJ SOLN
4.0000 mg | Freq: Once | INTRAMUSCULAR | Status: AC
  Administered 2017-02-08: 4 mg via INTRAVENOUS
  Filled 2017-02-08: qty 2

## 2017-02-08 MED ORDER — ONDANSETRON 4 MG PO TBDP
ORAL_TABLET | ORAL | Status: AC
  Filled 2017-02-08: qty 1

## 2017-02-08 MED ORDER — ONDANSETRON 4 MG PO TBDP
4.0000 mg | ORAL_TABLET | Freq: Once | ORAL | Status: AC | PRN
  Administered 2017-02-08: 4 mg via ORAL

## 2017-02-08 NOTE — ED Notes (Signed)
MD Ray at the bedside. 

## 2017-02-08 NOTE — ED Triage Notes (Signed)
Pt also reports rectal bleeding x 3-4 days, reports first episode after injury in June.  Pt states "some of it is darker, some is bright red."

## 2017-02-08 NOTE — ED Notes (Signed)
Vascular made aware pt need to be seen.

## 2017-02-08 NOTE — ED Notes (Signed)
EDP at bedside  

## 2017-02-08 NOTE — ED Notes (Signed)
Pt vomiting and dry heaving in triage.

## 2017-02-08 NOTE — ED Triage Notes (Signed)
Pt reports increased swelling and pain in LLE on Wednesday, improved since.Pt reports injury to LLE in June. [facility] for possible [facility], here today to follow up.

## 2017-02-08 NOTE — ED Notes (Signed)
Pt transported to vascular.  °

## 2017-02-08 NOTE — Progress Notes (Signed)
*  Preliminary Results* Left lower extremity venous duplex completed. Left lower extremity is negative for deep vein thrombosis. There is no evidence of left Baker's cyst.  02/08/2017 6:49 PM  Gertie FeyMichelle Fintan Grater, BS, RVT, RDCS, RDMS

## 2017-02-09 NOTE — ED Provider Notes (Signed)
[facility]-EMERGENCY DEPT Provider Note   CSN: [id number] Arrival date & time: 02/08/17  1413     History   Chief Complaint Chief Complaint  Patient presents with  . Foot Pain  . Leg Swelling  . Rectal Bleeding    HPI Sheila [name] is a 51 y.o. female.  HPI  51 year old female with recent foot fracture and leg swelling. She has history of second or after a fall. Has been using a cam walker. She was [facility] and [facility] for left foot swelling and wants to have an outpatient Doppler done the next day. This was on Wednesday and she is scheduled with Doppler on Thursday, yesterday. She states that due to the fact she was in the ED late that night she was unable to come in to have the Doppler done. She presents today with concern of swelling and DVT. She states that she has also had some bright red blood per rectum. This has been intermittent. She has not been lightheaded, had chest pain, or dyspnea. This have not been bowel movements but small amounts of bright red blood after bowel movements. She has no history of ulcer disease but thinks she has had some hemorrhoids.  Past Medical History:  Diagnosis Date  . Bartholin cyst   . Diverticulitis   . Endometriosis   . Hypertension     There are no active problems to display for this patient.   Past Surgical History:  Procedure Laterality Date  . ABDOMINAL HYSTERECTOMY    . APPENDECTOMY    . FOOT SURGERY Bilateral   . LITHOTRIPSY      OB History    Gravida Para Term Preterm AB Living   3 2 2   1      SAB TAB Ectopic Multiple Live Births     1     2       Home Medications    Prior to Admission medications   Medication Sig Start Date End Date Taking? Authorizing Provider  Aspirin-Salicylamide-Caffeine (BC HEADACHE POWDER PO) Take 1 packet by mouth every 8 (eight) hours as needed (for headaches).   Yes [provider]  atenolol (TENORMIN) 50 MG tablet Take 50 mg by mouth daily.   Yes [provider]  cetirizine (ZYRTEC) 10 MG tablet Take 10 mg by mouth daily as needed for allergies.    Yes [provider]  cholecalciferol (VITAMIN D) 1000 units tablet Take 1,000 Units by mouth daily.   Yes [provider]  citalopram (CELEXA) 40 MG tablet Take 40 mg by mouth daily.   Yes [provider]  estradiol (ESTRACE) 1 MG tablet Take 1 mg by mouth daily.   Yes [provider]  gabapentin (NEURONTIN) 600 MG tablet Take 600 mg by mouth 3 (three) times daily.   Yes [provider]  ibuprofen (ADVIL,MOTRIN) 600 MG tablet Take 1 tablet (600 mg total) by mouth every 8 (eight) hours as needed. Patient taking differently: Take 600 mg by mouth every 8 (eight) hours as needed (for pain or headaches).  01/02/17  Yes Felecia ShellingEvans, [name], DPM  lisinopril (PRINIVIL,ZESTRIL) 20 MG tablet Take 20 mg by mouth daily.   Yes [provider]  omeprazole (PRILOSEC) 20 MG capsule Take 20 mg by mouth daily as needed (for reflux symptoms).    Yes [provider]  Oxycodone HCl 10 MG TABS Take 10 mg by mouth four times a day as needed for pain 12/31/16  Yes [provider]  diazepam (VALIUM) 5 MG tablet Take 1 tablet (5 mg total) by mouth 2 (two) times daily. Patient not taking: Reported on 10/04/2016 06/13/14   Terri Piedra, PA-C  oxyCODONE-acetaminophen (PERCOCET) 10-325 MG tablet Take 1 tablet by mouth every 8 (eight) hours as needed for pain. Patient not taking: Reported on 02/08/2017 01/02/17   [name], DPM  sucralfate (CARAFATE) 1 G tablet Take 1 tablet (1 g total) by mouth 4 (four) times daily. Patient not taking: Reported on 10/04/2016 03/16/13   [name], PA-C    Family History Family History  Problem Relation Age of Onset  . Hypertension Mother   . Diabetes Mother   . Cancer Mother        uterine  . Hypertension Father   . Diabetes Father   . Cancer Father        liver and pancreatic    Social History Social History    Substance Use Topics  . Smoking status: Never Smoker  . Smokeless tobacco: Never Used  . Alcohol use No     Allergies   Patient has no known allergies.   Review of Systems Review of Systems  All other systems reviewed and are negative.    Physical Exam Updated Vital Signs BP 130/82 (BP Location: Right Arm)   Pulse 75   Temp 98.2 F (36.8 C) (Oral)   Resp 18   Ht 1.626 m (5\' 4" )   Wt 90.7 kg (200 lb)   SpO2 100%   BMI 34.33 kg/m   Physical Exam  Constitutional: She is oriented to person, place, and time. She appears well-developed and well-nourished. No distress.  HENT:  Head: Normocephalic and atraumatic.  Right Ear: External ear normal.  Left Ear: External ear normal.  Nose: Nose normal.  Eyes: Pupils are equal, round, and reactive to light. Conjunctivae and EOM are normal.  Neck: Normal range of motion. Neck supple.  Cardiovascular: Normal rate and regular rhythm.   Pulmonary/Chest: Effort normal.  Abdominal: Soft. Bowel sounds are normal. She exhibits no distension.  Genitourinary:  Genitourinary Comments: Digital rectal exam without any palpable masses. Stool is soft and brown  Musculoskeletal: Normal range of motion. She exhibits edema.  Left foot with some diffuse swelling and mild tenderness of calf  Neurological: She is alert and oriented to person, place, and time. She exhibits normal muscle tone. Coordination normal.  Skin: Skin is warm and dry.  Psychiatric: She has a normal mood and affect. Her behavior is normal. Thought content normal.  Nursing note and vitals reviewed.    ED Treatments / Results  Labs (all labs ordered are listed, but only abnormal results are displayed) Labs Reviewed  POC OCCULT BLOOD, ED - Abnormal; Notable for the following:       Result Value   Fecal Occult Bld POSITIVE (*)    All other components within normal limits  CBC  BASIC METABOLIC PANEL  PROTIME-INR    EKG  EKG Interpretation None        Radiology No results found.  Procedures Procedures (including critical care time)  Medications Ordered in ED Medications  ondansetron (ZOFRAN-ODT) disintegrating tablet 4 mg (4 mg Oral Given 02/08/17 1447)  sodium chloride 0.9 % bolus 500 mL (0 mLs Intravenous Stopped 02/08/17 1853)  ondansetron (ZOFRAN) injection 4 mg (4 mg Intravenous Given 02/08/17 1820)  oxyCODONE-acetaminophen (PERCOCET/ROXICET) 5-325 MG per tablet 2 tablet (2 tablets Oral Given 02/08/17 1921)     Initial Impression / Assessment and Plan / ED  Course  I have reviewed the triage vital signs and the nursing notes.  Pertinent labs & imaging results that were available during my care of the patient were reviewed by me and considered in my medical decision making (see chart for details).   1- no evidence for dvt on doppler 2- rectal bleeding- heme postive stool but no rectal bleeding here.  Hemodynamically stable with normal hemoglobin. Patient advised for follow-up with GI. We discussed return precautions and voices understanding.   Final Clinical Impressions(s) / ED Diagnoses   Final diagnoses:  Leg swelling  Blood in stool    New Prescriptions Discharge Medication List as of 02/08/2017  7:18 PM       Margarita Grizzle, MD 02/09/17 2258

## 2017-03-14 ENCOUNTER — Telehealth: Payer: Self-pay | Admitting: *Deleted

## 2017-03-14 NOTE — Telephone Encounter (Signed)
"  I need to know what type of surgery I need to have so I can check with my insurance to see how much they will pay."  He said you will be having an Open Reduction Internal Fixation (ORIF) of the 2nd metatarsal left foot for a fracture.

## 2017-09-10 ENCOUNTER — Emergency Department (HOSPITAL_COMMUNITY)
Admission: EM | Admit: 2017-09-10 | Discharge: 2017-09-11 | Disposition: A | Discharge status: Home / Self Care | Payer: Medicaid Other | Attending: Emergency Medicine | Admitting: Emergency Medicine

## 2017-09-10 ENCOUNTER — Other Ambulatory Visit: Payer: Self-pay

## 2017-09-10 ENCOUNTER — Encounter (HOSPITAL_COMMUNITY): Payer: Self-pay | Admitting: Emergency Medicine

## 2017-09-10 ENCOUNTER — Emergency Department (HOSPITAL_COMMUNITY): Payer: Medicaid Other

## 2017-09-10 DIAGNOSIS — Z7982 Long term (current) use of aspirin: Secondary | ICD-10-CM | POA: Insufficient documentation

## 2017-09-10 DIAGNOSIS — Z79899 Other long term (current) drug therapy: Secondary | ICD-10-CM | POA: Insufficient documentation

## 2017-09-10 DIAGNOSIS — R112 Nausea with vomiting, unspecified: Secondary | ICD-10-CM | POA: Diagnosis present

## 2017-09-10 DIAGNOSIS — J111 Influenza due to unidentified influenza virus with other respiratory manifestations: Secondary | ICD-10-CM | POA: Diagnosis not present

## 2017-09-10 DIAGNOSIS — I10 Essential (primary) hypertension: Secondary | ICD-10-CM | POA: Diagnosis not present

## 2017-09-10 DIAGNOSIS — R69 Illness, unspecified: Secondary | ICD-10-CM

## 2017-09-10 MED ORDER — BENZONATATE 100 MG PO CAPS: 100.0000 mg | ORAL_CAPSULE | Freq: Three times a day (TID) | ORAL | 0 refills | Status: DC

## 2017-09-10 MED ORDER — ONDANSETRON 4 MG PO TBDP: ORAL_TABLET | ORAL | 0 refills | Status: DC

## 2017-09-10 MED ORDER — KETOROLAC TROMETHAMINE 60 MG/2ML IM SOLN
30.0000 mg | Freq: Once | INTRAMUSCULAR | Status: AC
  Administered 2017-09-10: 30 mg via INTRAMUSCULAR
  Filled 2017-09-10: qty 2

## 2017-09-10 MED ORDER — ONDANSETRON 4 MG PO TBDP
8.0000 mg | ORAL_TABLET | Freq: Once | ORAL | Status: AC
  Administered 2017-09-10: 8 mg via ORAL
  Filled 2017-09-10: qty 2

## 2017-09-10 NOTE — ED Triage Notes (Addendum)
Pt to ED c/o flu-like symptoms - congestion, runny nose, dry cough, generalized body aches, fevers/chills. Patient states her grandchildren have recently had the flu. Resp e/u, skin warm/dry. Reports fever earlier today but hasn't been taking any OTC medications.

## 2017-09-10 NOTE — ED Provider Notes (Signed)
[facility] Provider Note   CSN: [id number] Arrival date & time: 09/10/17  2030     History   Chief Complaint Chief Complaint  Patient presents with  . Generalized Body Aches    HPI Sheila [name] is a 52 y.o. female.  Patient reports influenza-like symptoms since Friday. Grandchildren have recently had the flu.   The history is provided by the patient. No language interpreter was used.  URI   This is a new problem. The current episode started more than 2 days ago. The problem has been gradually worsening. Associated symptoms include nausea, vomiting, congestion, headaches, rhinorrhea, sneezing and cough. Associated symptoms comments: Body aches. She has tried other medications for the symptoms. The treatment provided mild relief.    Past Medical History:  Diagnosis Date  . Bartholin cyst   . Diverticulitis   . Endometriosis   . Hypertension     There are no active problems to display for this patient.   Past Surgical History:  Procedure Laterality Date  . ABDOMINAL HYSTERECTOMY    . APPENDECTOMY    . FOOT SURGERY Bilateral   . LITHOTRIPSY      OB History    Gravida Para Term Preterm AB Living   3 2 2   1      SAB TAB Ectopic Multiple Live Births     1     2       Home Medications    Prior to Admission medications   Medication Sig Start Date End Date Taking? Authorizing Provider  Aspirin-Salicylamide-Caffeine (BC HEADACHE POWDER PO) Take 1 packet by mouth every 8 (eight) hours as needed (for headaches).    [provider]  atenolol (TENORMIN) 50 MG tablet Take 50 mg by mouth daily.    [provider]  cetirizine (ZYRTEC) 10 MG tablet Take 10 mg by mouth daily as needed for allergies.     [provider]  cholecalciferol (VITAMIN D) 1000 units tablet Take 1,000 Units by mouth daily.    [provider]  citalopram (CELEXA) 40 MG tablet Take 40 mg by mouth daily.    [provider]  diazepam (VALIUM) 5 MG tablet Take 1 tablet (5 mg total) by mouth 2 (two) times daily. Patient not taking: Reported on 10/04/2016 06/13/14   Forcucci, Toni Amendourtney, PA-C  estradiol (ESTRACE) 1 MG tablet Take 1 mg by mouth daily.    [provider]  gabapentin (NEURONTIN) 600 MG tablet Take 600 mg by mouth 3 (three) times daily.    [provider]  ibuprofen (ADVIL,MOTRIN) 600 MG tablet Take 1 tablet (600 mg total) by mouth every 8 (eight) hours as needed. Patient taking differently: Take 600 mg by mouth every 8 (eight) hours as needed (for pain or headaches).  01/02/17   Felecia ShellingEvans, [name], DPM  lisinopril (PRINIVIL,ZESTRIL) 20 MG tablet Take 20 mg by mouth daily.    [provider]  omeprazole (PRILOSEC) 20 MG capsule Take 20 mg by mouth daily as needed (for reflux symptoms).     [provider]  Oxycodone HCl 10 MG TABS Take 10 mg by mouth four times a day as needed for pain 12/31/16   [provider]  oxyCODONE-acetaminophen (PERCOCET) 10-325 MG tablet Take 1 tablet by mouth every 8 (eight) hours as needed for pain. Patient not taking: Reported on 02/08/2017 01/02/17   Felecia ShellingEvans, [name], DPM  sucralfate (CARAFATE) 1 G tablet Take 1 tablet (1 g total) by mouth  4 (four) times daily. Patient not taking: Reported on 10/04/2016 03/16/13   [name], PA-C    Family History Family History  Problem Relation Age of Onset  . Hypertension Mother   . Diabetes Mother   . Cancer Mother        uterine  . Hypertension Father   . Diabetes Father   . Cancer Father        liver and pancreatic    Social History Social History   Tobacco Use  . Smoking status: Never Smoker  . Smokeless tobacco: Never Used  Substance Use Topics  . Alcohol use: No  . Drug use: No     Allergies   Patient has no known allergies.   Review of Systems Review of Systems  HENT: Positive for congestion, rhinorrhea and sneezing.   Respiratory: Positive for cough.     Gastrointestinal: Positive for nausea and vomiting.  Musculoskeletal: Positive for myalgias.  Neurological: Positive for headaches.  All other systems reviewed and are negative.    Physical Exam Updated Vital Signs BP 139/84 (BP Location: Right Arm)   Pulse 77   Temp 98 F (36.7 C) (Oral)   Resp 16   Ht 5\' 4"  (1.626 m)   Wt 86.2 kg (190 lb)   SpO2 98%   BMI 32.61 kg/m   Physical Exam  Constitutional: She is oriented to person, place, and time. She appears well-developed.  HENT:  Head: Normocephalic.  Mouth/Throat: Oropharynx is clear and moist.  Eyes: Conjunctivae are normal.  Neck: Neck supple.  Cardiovascular: Normal rate and regular rhythm.  Pulmonary/Chest: Effort normal and breath sounds normal.  Abdominal: Soft. She exhibits no distension. There is no tenderness.  Musculoskeletal: Normal range of motion. She exhibits no edema.  Lymphadenopathy:    She has no cervical adenopathy.  Neurological: She is alert and oriented to person, place, and time.  Skin: Skin is warm and dry.  Psychiatric: She has a normal mood and affect.  Nursing note and vitals reviewed.    ED Treatments / Results  Labs (all labs ordered are listed, but only abnormal results are displayed) Labs Reviewed - No data to display  EKG  EKG Interpretation None       Radiology Dg Chest 2 View  Result Date: 09/10/2017 CLINICAL DATA:  Fever and productive cough.  Body aches. EXAM: CHEST  2 VIEW COMPARISON:  Radiographs 10/11/2007 FINDINGS: The cardiomediastinal contours are normal. Mild bronchial thickening. Pulmonary vasculature is normal. No consolidation, pleural effusion, or pneumothorax. No acute osseous abnormalities are seen. IMPRESSION: Bronchial thickening which can be seen with bronchitis. No consolidation. Electronically Signed   By: Rubye Oaks M.D.   On: 09/10/2017 22:57    Procedures Procedures (including critical care time)  Medications Ordered in ED Medications - No  data to display   Initial Impression / Assessment and Plan / ED Course  I have reviewed the triage vital signs and the nursing notes.  Pertinent labs & imaging results that were available during my care of the patient were reviewed by me and considered in my medical decision making (see chart for details).     Patient with symptoms consistent with influenza.  Vitals are stable, low-grade fever.  No signs of dehydration, tolerating PO's.  Lungs are clear.   Patient will be discharged with instructions to orally hydrate, rest, and use over-the-counter medications such as anti-inflammatories ibuprofen and Aleve for muscle aches and Tylenol for fever.  Patient will also be given a cough  suppressant.   Final Clinical Impressions(s) / ED Diagnoses   Final diagnoses:  Influenza-like illness       Felicie Morn, NP 09/11/17 0033    Tegeler, Canary Brim, MD 09/11/17 [phone]

## 2017-09-11 MED ORDER — PROMETHAZINE HCL 25 MG PO TABS: 25.0000 mg | ORAL_TABLET | Freq: Three times a day (TID) | ORAL | 0 refills | Status: AC | PRN

## 2017-09-11 MED ORDER — BENZONATATE 100 MG PO CAPS: 100.0000 mg | ORAL_CAPSULE | Freq: Three times a day (TID) | ORAL | 0 refills | Status: AC

## 2018-01-05 ENCOUNTER — Emergency Department (HOSPITAL_COMMUNITY): Payer: Medicaid Other

## 2018-01-05 ENCOUNTER — Emergency Department (HOSPITAL_COMMUNITY)
Admission: EM | Admit: 2018-01-05 | Discharge: 2018-01-05 | Disposition: A | Discharge status: Home / Self Care | Payer: Medicaid Other | Attending: Emergency Medicine | Admitting: Emergency Medicine

## 2018-01-05 ENCOUNTER — Encounter (HOSPITAL_COMMUNITY): Payer: Self-pay | Admitting: Nurse Practitioner

## 2018-01-05 DIAGNOSIS — I10 Essential (primary) hypertension: Secondary | ICD-10-CM | POA: Insufficient documentation

## 2018-01-05 DIAGNOSIS — M25562 Pain in left knee: Secondary | ICD-10-CM | POA: Diagnosis present

## 2018-01-05 DIAGNOSIS — W010XXA Fall on same level from slipping, tripping and stumbling without subsequent striking against object, initial encounter: Secondary | ICD-10-CM | POA: Diagnosis not present

## 2018-01-05 DIAGNOSIS — Z79899 Other long term (current) drug therapy: Secondary | ICD-10-CM | POA: Diagnosis not present

## 2018-01-05 DIAGNOSIS — M25462 Effusion, left knee: Secondary | ICD-10-CM | POA: Diagnosis not present

## 2018-01-05 MED ORDER — KETOROLAC TROMETHAMINE 30 MG/ML IJ SOLN
30.0000 mg | Freq: Once | INTRAMUSCULAR | Status: AC
  Administered 2018-01-05: 30 mg via INTRAMUSCULAR
  Filled 2018-01-05: qty 1

## 2018-01-05 NOTE — Discharge Instructions (Signed)
Your x-ray shows no evidence of fracture or dislocation there is a small joint effusion.  You may have injured 1 of the ligaments or tendons in your knee.  Please use the knee immobilizer and crutches provided in addition to your home pain medication I would like for you to use ibuprofen or naproxen daily.  Please keep the knee elevated as much as possible and use ice.  You can also use over-the-counter muscle rubs or salon pas patches for pain relief.  Please follow-up with your orthopedist Dr. Lynnette CaffeySisco for continued evaluation since he has done your knee surgeries in the past.  Return to the emergency department for worsening knee pain and swelling, redness, fevers and inability to bend and straighten the knee or any other new or concerning symptoms.

## 2018-01-05 NOTE — ED Triage Notes (Signed)
Pt states she fell about a week ago injuring her left knee. She c/o pain and progressively swelling and her chronic pain medicine (oxycodone) is not helping.

## 2018-01-05 NOTE — ED Provider Notes (Signed)
[facility]-EMERGENCY DEPT Provider Note   CSN: [id number] Arrival date & time: 01/05/18  1945     History   Chief Complaint Chief Complaint  Patient presents with  . Knee Pain    Left    HPI Sheila [name] is a 52 y.o. female.  Sheila GasmanRebecca [name] is a 52 y.o. Female with a history of hypertension, diverticulitis, chronic back pain and arthritis, presents to the emergency department for evaluation of left knee pain.  Patient reports about 5 days ago she tripped and fell while walking around her house at night landing directly on the anterior aspect of the left knee.  She reports since then she has had worsening pain and swelling.  She reports it feels slightly warm to the touch but denies any overlying erythema she has not had any fevers.  Patient reports she is able to bend and extend it somewhat this has decreased over the past few days the swelling has worsened.  Patient reports she is tried to elevated a little bit, but has continued to bear weight on it.  She has been taking her chronic oxycodone which she uses for her back pain, without much improvement.  She has not tried anything else to treat her symptoms.  Pain is made worse with palpation or movement, and improved some with elevation.  Patient denies any numbness tingling or weakness in the leg.  No swelling distal or proximal to the knee.  No injury to the foot or ankle.  Patient reports history of severe arthritis in the knee, she is had to have surgery previously with Dr. Lynnette Caffe[place]Sisco [place]     Past Medical History:  Diagnosis Date  . Bartholin cyst   . Diverticulitis   . Endometriosis   . Hypertension     There are no active problems to display for this patient.   Past Surgical History:  Procedure Laterality Date  . ABDOMINAL HYSTERECTOMY    . APPENDECTOMY    . FOOT SURGERY Bilateral   . LITHOTRIPSY       OB History    Gravida  3   Para  2   Term  2   Preterm     AB  1   Living        SAB      TAB  1   Ectopic      Multiple      Live Births  2            Home Medications    Prior to Admission medications   Medication Sig Start Date End Date Taking? Authorizing Provider  Aspirin-Salicylamide-Caffeine (BC HEADACHE POWDER PO) Take 1 packet by mouth every 8 (eight) hours as needed (for headaches).    [provider]  atenolol (TENORMIN) 50 MG tablet Take 50 mg by mouth daily.    [provider]  benzonatate (TESSALON) 100 MG capsule Take 1 capsule (100 mg total) by mouth every 8 (eight) hours. 09/11/17   [name], David, NP  cetirizine (ZYRTEC) 10 MG tablet Take 10 mg by mouth daily as needed for allergies.     [provider]  cholecalciferol (VITAMIN D) 1000 units tablet Take 1,000 Units by mouth daily.    [provider]  citalopram (CELEXA) 40 MG tablet Take 40 mg by mouth daily.    [provider]  diazepam (VALIUM) 5 MG tablet Take 1 tablet (5 mg total) by mouth 2 (two) times daily. Patient not taking:  Reported on 10/04/2016 06/13/14   Forcucci, Toni Amend, PA-C  estradiol (ESTRACE) 1 MG tablet Take 1 mg by mouth daily.    [provider]  gabapentin (NEURONTIN) 600 MG tablet Take 600 mg by mouth 3 (three) times daily.    [provider]  ibuprofen (ADVIL,MOTRIN) 600 MG tablet Take 1 tablet (600 mg total) by mouth every 8 (eight) hours as needed. Patient taking differently: Take 600 mg by mouth every 8 (eight) hours as needed (for pain or headaches).  01/02/17   [name], DPM  lisinopril (PRINIVIL,ZESTRIL) 20 MG tablet Take 20 mg by mouth daily.    [provider]  omeprazole (PRILOSEC) 20 MG capsule Take 20 mg by mouth daily as needed (for reflux symptoms).     [provider]  Oxycodone HCl 10 MG TABS Take 10 mg by mouth four times a day as needed for pain 12/31/16   [provider]  oxyCODONE-acetaminophen (PERCOCET) 10-325 MG tablet Take 1  tablet by mouth every 8 (eight) hours as needed for pain. Patient not taking: Reported on 02/08/2017 01/02/17   [name], DPM  promethazine (PHENERGAN) 25 MG tablet Take 1 tablet (25 mg total) by mouth every 8 (eight) hours as needed for nausea or vomiting. 09/11/17   Felicie Morn, NP  sucralfate (CARAFATE) 1 G tablet Take 1 tablet (1 g total) by mouth 4 (four) times daily. Patient not taking: Reported on 10/04/2016 03/16/13   [name], PA-C    Family History Family History  Problem Relation Age of Onset  . Hypertension Mother   . Diabetes Mother   . Cancer Mother        uterine  . Hypertension Father   . Diabetes Father   . Cancer Father        liver and pancreatic    Social History Social History   Tobacco Use  . Smoking status: Never Smoker  . Smokeless tobacco: Never Used  Substance Use Topics  . Alcohol use: No  . Drug use: No     Allergies   Patient has no known allergies.   Review of Systems Review of Systems  Constitutional: Negative for chills and fever.  Musculoskeletal: Positive for joint swelling.  Skin: Negative for color change and rash.  Neurological: Negative for weakness and numbness.     Physical Exam Updated Vital Signs BP (!) 152/92 (BP Location: Left Arm)   Pulse 86   Temp 98.3 F (36.8 C) (Oral)   Resp 18   SpO2 99%   Physical Exam  Constitutional: She appears well-developed and well-nourished. No distress.  HENT:  Head: Normocephalic and atraumatic.  Eyes: Right eye exhibits no discharge. Left eye exhibits no discharge.  Pulmonary/Chest: Effort normal. No respiratory distress.  Musculoskeletal:  Tenderness to palpation over the left knee with notable swelling, there is a small palpable effusion, knee is slightly warm, but there is no overlying erythema, patient is able to flex the knee greater than 90 degrees and almost completely extend the knee.  2+ DP and TP pulses.  Sensation intact.   Neurological: She is alert.  Coordination normal.  Skin: Skin is warm and dry. Capillary refill takes less than 2 seconds. She is not diaphoretic.  Psychiatric: She has a normal mood and affect. Her behavior is normal.  Nursing note and vitals reviewed.    ED Treatments / Results  Labs (all labs ordered are listed, but only abnormal results are displayed) Labs Reviewed - No data to  display  EKG None  Radiology Dg Knee Complete 4 Views Left  Result Date: 01/05/2018 CLINICAL DATA:  Fall 1 week ago.  Left knee pain and swelling. EXAM: LEFT KNEE - COMPLETE 4+ VIEW COMPARISON:  Left knee MRI 12/20/2011. FINDINGS: Progressive degenerative changes are present at the patellofemoral joint and in the medial compartment. No acute or healing fracture is present. A small joint effusion is present. IMPRESSION: 1. Progressive degenerative changes of the right knee. 2. Small joint effusion. This is nonspecific. Internal derangement is not excluded. Electronically Signed   By: Marin Roberts M.D.   On: 01/05/2018 21:30    Procedures Procedures (including critical care time)  Medications Ordered in ED Medications  ketorolac (TORADOL) 30 MG/ML injection 30 mg (30 mg Intramuscular Given 01/05/18 2145)     Initial Impression / Assessment and Plan / ED Course  I have reviewed the triage vital signs and the nursing notes.  Pertinent labs & imaging results that were available during my care of the patient were reviewed by me and considered in my medical decision making (see chart for details).  Patient presents for evaluation of left knee pain and swelling after a fall.  Patient with normal vitals.  She denies any fevers, on exam knee is tender to palpation with some swelling noted, there is mild warmth but no overlying erythema, patient is able to bend and extend the knee greater than 90 degrees.  Left extremity is neurovascularly intact.  X-ray shows a small joint effusion as well as progressive degenerative changes, no evidence  of fracture dislocation.  It is possible that patient has tenderness or ligamentous injury.  We will place her in knee immobilizer and crutches.  Encouraged her to use NSAIDs in addition to her home pain medication regimen.  We will have her follow back up with her orthopedist Dr. Lynnette Caffey for further management.  Return precautions discussed.  Patient expresses understanding and is in agreement with plan.  Final Clinical Impressions(s) / ED Diagnoses   Final diagnoses:  Acute pain of left knee  Effusion of left knee    ED Discharge Orders    None       Dartha Lodge, [place] 01/05/18 2210    Tegeler, Canary Brim, MD 01/05/18 2257

## 2018-03-13 IMAGING — CR DG FOOT COMPLETE 3+V*L*
3 series · 3 of 3 positions shown · non-contrast
Comparison: 01/01/2017

CLINICAL DATA: Fall

EXAM:
LEFT FOOT - COMPLETE 3+ VIEW

[x foot ap left]
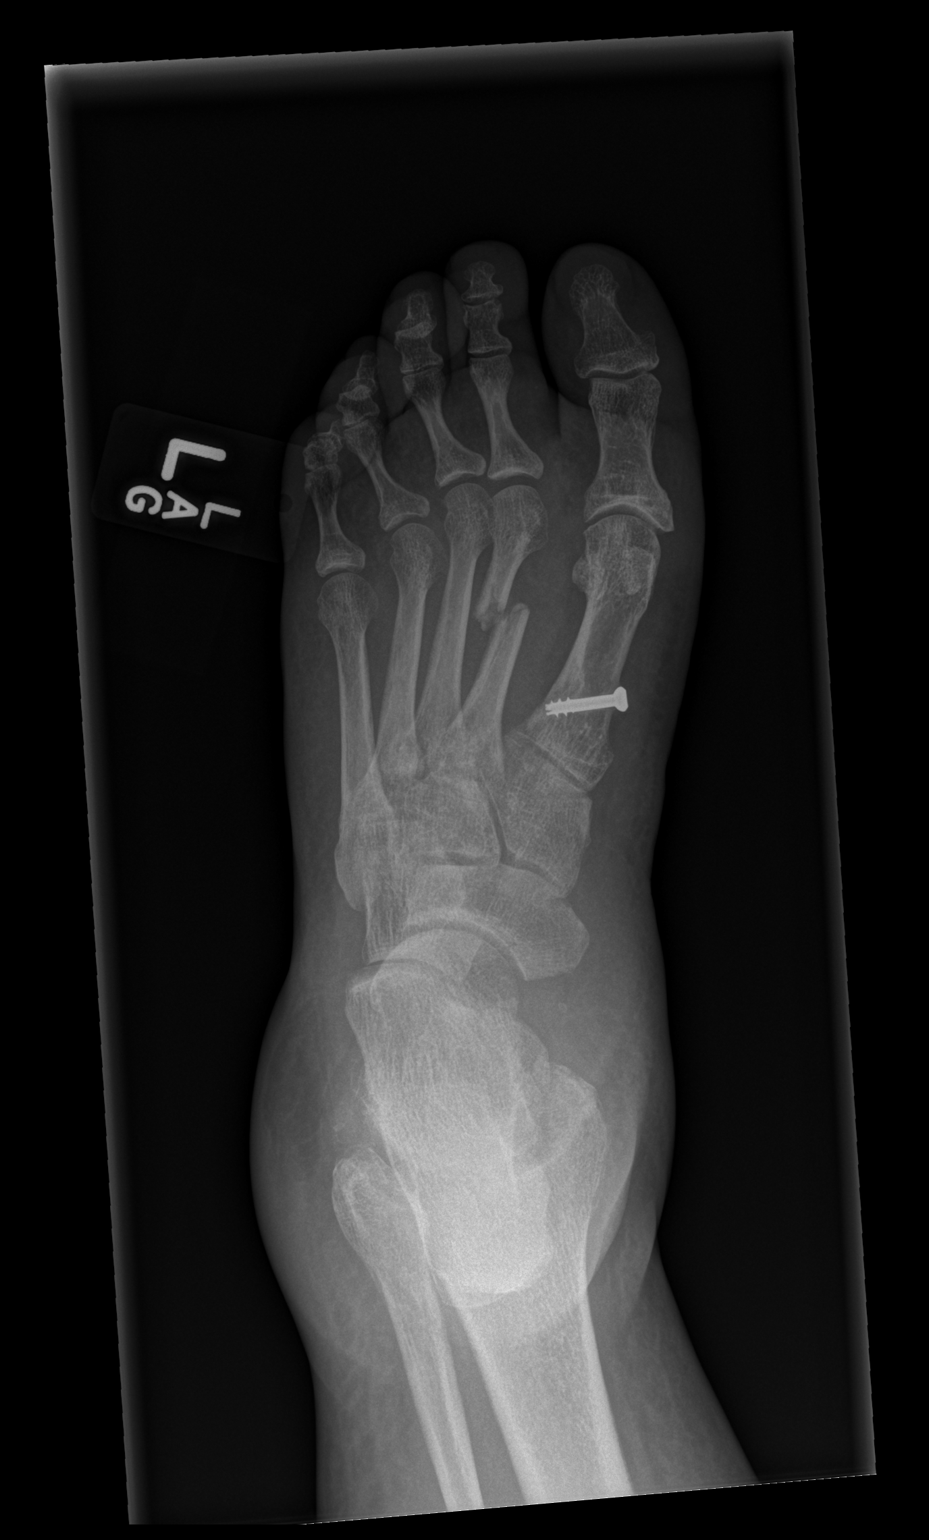

[x foot obl left]
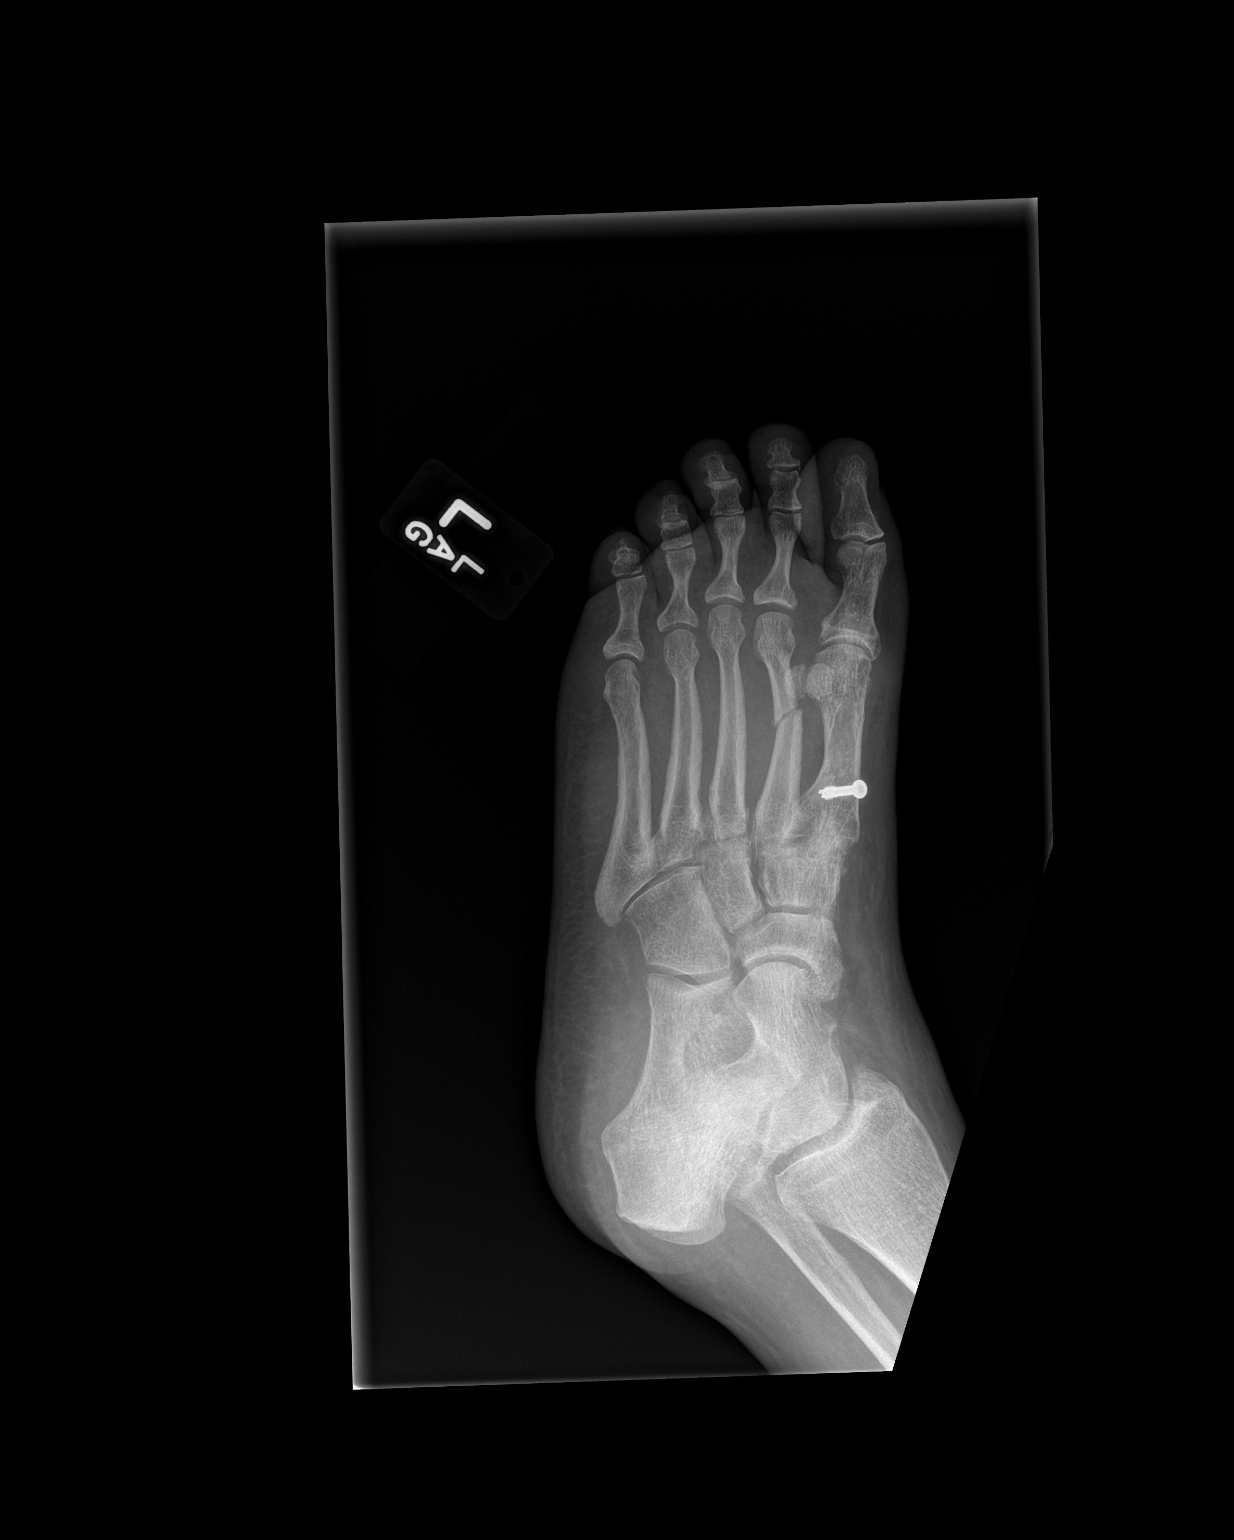

[x foot lat left]
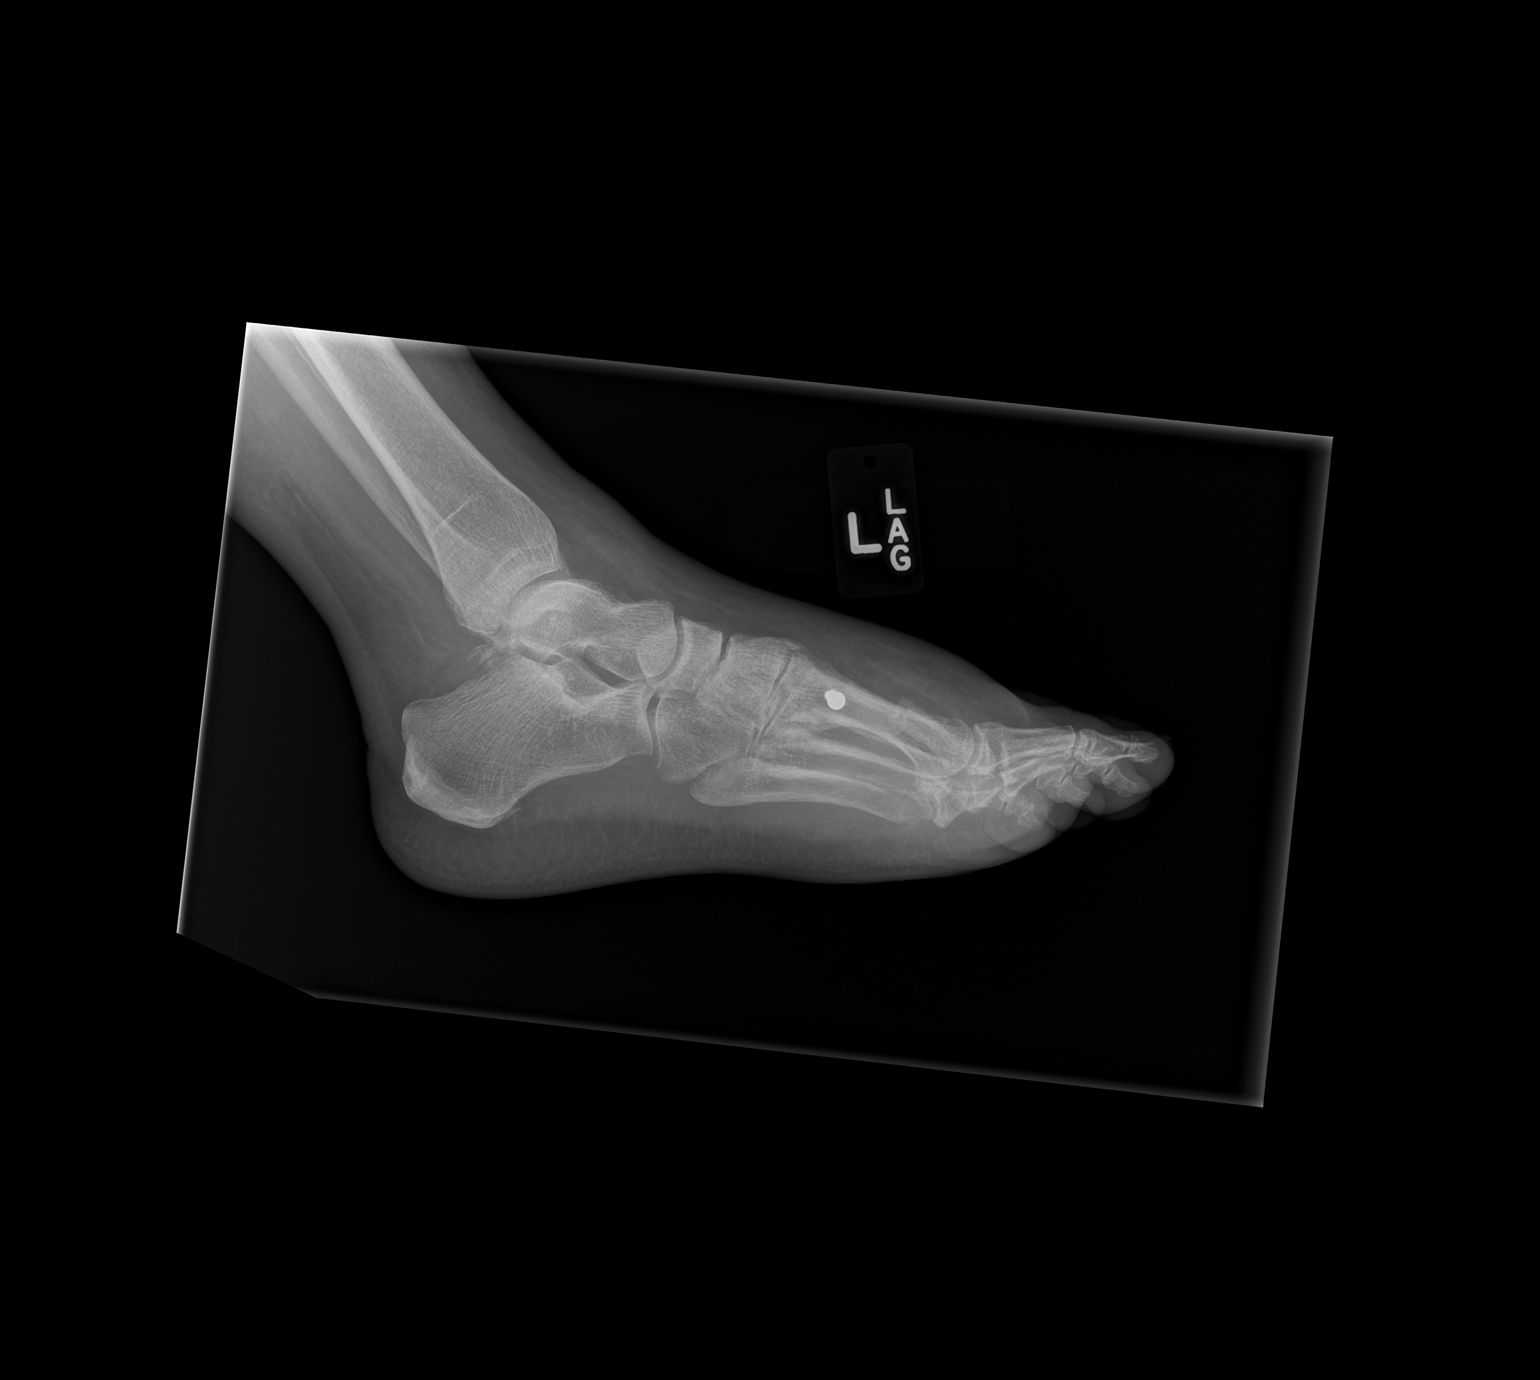

[3 of 3 positions shown; findings below may reference images not displayed]

FINDINGS: Status post screw fixation base of first metatarsal as before.
Fracture involving the midshaft of the second metacarpal with
increased displacement compared to prior. Approximately 1 bone with
of lateral displacement of distal fracture fragment and about 5 mm
of overriding. No subluxation seen. Possible additional lucency in
the medial navicular.
IMPRESSION: 1. Increased displacement of the second metatarsal fracture without
substantial interval healing
2. Possible linear lucency/nondisplaced fracture involving the
medial navicular, correlate clinically for focal tenderness to this
region.

## 2019-10-22 ENCOUNTER — Ambulatory Visit (INDEPENDENT_AMBULATORY_CARE_PROVIDER_SITE_OTHER): Payer: Medicaid Other

## 2019-10-22 ENCOUNTER — Other Ambulatory Visit: Payer: Self-pay | Admitting: Sports Medicine

## 2019-10-22 ENCOUNTER — Ambulatory Visit: Payer: Medicaid Other | Admitting: Sports Medicine

## 2019-10-22 ENCOUNTER — Encounter: Payer: Self-pay | Admitting: Sports Medicine

## 2019-10-22 ENCOUNTER — Ambulatory Visit (INDEPENDENT_AMBULATORY_CARE_PROVIDER_SITE_OTHER): Payer: Medicaid Other | Admitting: Sports Medicine

## 2019-10-22 ENCOUNTER — Other Ambulatory Visit: Payer: Self-pay

## 2019-10-22 DIAGNOSIS — M722 Plantar fascial fibromatosis: Secondary | ICD-10-CM

## 2019-10-22 DIAGNOSIS — M79671 Pain in right foot: Secondary | ICD-10-CM

## 2019-10-22 DIAGNOSIS — L84 Corns and callosities: Secondary | ICD-10-CM

## 2019-10-22 MED ORDER — TRIAMCINOLONE ACETONIDE 10 MG/ML IJ SUSP
10.0000 mg | Freq: Once | INTRAMUSCULAR | Status: AC
Start: 2019-10-22 — End: 2019-10-22
  Administered 2019-10-22: 10 mg

## 2019-10-22 NOTE — Progress Notes (Signed)
Subjective: Sheila [name] is a 54 y.o. female patient presents to office with complaint of moderate heel pain on the right. Patient admits to post static dyskinesia for weeks in duration. Patient has treated this problem with Mobic from PCP with no relief, pain 10/10 worse barefoot and with standing/AM, admits to callus skin possible warts at bottom of left foot, not sure if it started after wearing a pair of shoes that were too big. Denies any other pedal complaints.   Review of Systems  All other systems reviewed and are negative.  There are no problems to display for this patient.   Current Outpatient Medications on File Prior to Visit  Medication Sig Dispense Refill  . Aspirin-Salicylamide-Caffeine (BC HEADACHE POWDER PO) Take 1 packet by mouth every 8 (eight) hours as needed (for headaches).    Marland Kitchen atenolol (TENORMIN) 50 MG tablet Take 50 mg by mouth daily.    . benzonatate (TESSALON) 100 MG capsule Take 1 capsule (100 mg total) by mouth every 8 (eight) hours. 21 capsule 0  . cetirizine (ZYRTEC) 10 MG tablet Take 10 mg by mouth daily as needed for allergies.     . cholecalciferol (VITAMIN D) 1000 units tablet Take 1,000 Units by mouth daily.    . citalopram (CELEXA) 40 MG tablet Take 40 mg by mouth daily.    . diazepam (VALIUM) 5 MG tablet Take 1 tablet (5 mg total) by mouth 2 (two) times daily. (Patient not taking: Reported on 10/04/2016) 20 tablet 0  . estradiol (ESTRACE) 1 MG tablet Take 1 mg by mouth daily.    Marland Kitchen gabapentin (NEURONTIN) 600 MG tablet Take 600 mg by mouth 3 (three) times daily.    Marland Kitchen ibuprofen (ADVIL,MOTRIN) 600 MG tablet Take 1 tablet (600 mg total) by mouth every 8 (eight) hours as needed. (Patient taking differently: Take 600 mg by mouth every 8 (eight) hours as needed (for pain or headaches). ) 60 tablet 1  . lisinopril (PRINIVIL,ZESTRIL) 20 MG tablet Take 20 mg by mouth daily.    Marland Kitchen omeprazole (PRILOSEC) 20 MG capsule Take 20 mg by mouth daily as needed (for reflux  symptoms).     . Oxycodone HCl 10 MG TABS Take 10 mg by mouth four times a day as needed for pain  0  . oxyCODONE-acetaminophen (PERCOCET) 10-325 MG tablet Take 1 tablet by mouth every 8 (eight) hours as needed for pain. (Patient not taking: Reported on 02/08/2017) 30 tablet 0  . promethazine (PHENERGAN) 25 MG tablet Take 1 tablet (25 mg total) by mouth every 8 (eight) hours as needed for nausea or vomiting. 6 tablet 0  . sucralfate (CARAFATE) 1 G tablet Take 1 tablet (1 g total) by mouth 4 (four) times daily. (Patient not taking: Reported on 10/04/2016) 60 tablet 0   No current facility-administered medications on file prior to visit.    No Known Allergies  Objective: Physical Exam General: The patient is alert and oriented x3 in no acute distress.  Dermatology: Skin is warm, dry and supple bilateral lower extremities. Nails 1-10 are normal. + callus sub met 2-3 and medial 1st toe on left. There is no erythema, edema, no eccymosis, no open lesions present. Integument is otherwise unremarkable.  Vascular: Dorsalis Pedis pulse and Posterior Tibial pulse are 2/4 bilateral. Capillary fill time is immediate to all digits.  Neurological: Grossly intact to light touch with an achilles reflex of +2/5 and a  negative Tinel's sign bilateral.  Musculoskeletal: Tenderness to palpation at the medial calcaneal  tubercale and through the insertion of the plantar fascia on the right foot. No pain with compression of calcaneus bilateral. No pain with tuning fork to calcaneus bilateral. No pain with calf compression bilateral. There is decreased Ankle joint range of motion bilateral. All other joints range of motion within normal limits bilateral. Strength 5/5 in all groups bilateral.   Gait: Unassisted, Antalgic avoid weight on left/right heel  Xray, Right/Left foot:  Normal osseous mineralization. Joint spaces preserved. No fracture/dislocation/boney destruction. Calcaneal spur present with mild thickening  of plantar fascia. No other soft tissue abnormalities or radiopaque foreign bodies.   Assessment and Plan: Problem List Items Addressed This Visit    None    Visit Diagnoses    Plantar fasciitis of right foot    -  Primary   Relevant Medications   triamcinolone acetonide (KENALOG) 10 MG/ML injection 10 mg (Completed) (Start on 10/22/2019  7:15 PM)   Callus of foot       Foot pain, bilateral          -Complete examination performed.  -Xrays reviewed -Discussed with patient in detail calluses to the plantar aspect of the left foot -Mechanically debrided callus areas x2 using a sterile chisel blade without incident -Recommend daily skin emollients -Discussed with patient in detail the condition of plantar fasciitis, how this occurs and general treatment options. Explained both conservative and surgical treatments.  -After oral consent and aseptic prep, injected a mixture containing 1 ml of 2%  plain lidocaine, 1 ml 0.5% plain marcaine, 0.5 ml of kenalog 10 and 0.5 ml of dexamethasone phosphate into right heel. Post-injection care discussed with patient.  -Continue with Mobic as prescribed by PCP -Recommended good supportive shoes with offloading padding as dispensed for both left and right foot -Explained and dispensed to patient daily stretching exercises. -Recommend patient to ice affected area 1-2x daily. -Patient to return to office in 4 weeks for follow up or sooner if problems or questions arise.  Landis Martins, DPM

## 2019-11-19 ENCOUNTER — Ambulatory Visit: Payer: Medicaid Other | Admitting: Sports Medicine

## 2019-11-27 ENCOUNTER — Other Ambulatory Visit: Payer: Self-pay

## 2019-11-27 ENCOUNTER — Encounter: Payer: Self-pay | Admitting: Sports Medicine

## 2019-11-27 ENCOUNTER — Ambulatory Visit (INDEPENDENT_AMBULATORY_CARE_PROVIDER_SITE_OTHER): Payer: Medicaid Other | Admitting: Sports Medicine

## 2019-11-27 DIAGNOSIS — M722 Plantar fascial fibromatosis: Secondary | ICD-10-CM

## 2019-11-27 DIAGNOSIS — M79671 Pain in right foot: Secondary | ICD-10-CM

## 2019-11-27 MED ORDER — TRIAMCINOLONE ACETONIDE 10 MG/ML IJ SUSP
10.0000 mg | Freq: Once | INTRAMUSCULAR | Status: AC
  Administered 2019-11-27: 21:00:00 10 mg

## 2019-11-27 NOTE — Progress Notes (Signed)
Subjective: Sheila [name] is a 54 y.o. female returns to office for follow up evaluation after Right heel injection for plantar fasciitis, injection #1 administered 4 weeks ago. Patient states that the injection seems to help his/her pain; pain is now 5/10 and has decreased in frequency to the area. Patient denies any recent changes in medications or new problems since last visit.   Completed Mobic.  There are no problems to display for this patient.   Current Outpatient Medications on File Prior to Visit  Medication Sig Dispense Refill  . Aspirin-Salicylamide-Caffeine (BC HEADACHE POWDER PO) Take 1 packet by mouth every 8 (eight) hours as needed (for headaches).    Marland Kitchen atenolol (TENORMIN) 50 MG tablet Take 50 mg by mouth daily.    . benzonatate (TESSALON) 100 MG capsule Take 1 capsule (100 mg total) by mouth every 8 (eight) hours. 21 capsule 0  . cetirizine (ZYRTEC) 10 MG tablet Take 10 mg by mouth daily as needed for allergies.     . cholecalciferol (VITAMIN D) 1000 units tablet Take 1,000 Units by mouth daily.    . citalopram (CELEXA) 40 MG tablet Take 40 mg by mouth daily.    . diazepam (VALIUM) 5 MG tablet Take 1 tablet (5 mg total) by mouth 2 (two) times daily. (Patient not taking: Reported on 10/04/2016) 20 tablet 0  . estradiol (ESTRACE) 1 MG tablet Take 1 mg by mouth daily.    Marland Kitchen gabapentin (NEURONTIN) 600 MG tablet Take 600 mg by mouth 3 (three) times daily.    Marland Kitchen ibuprofen (ADVIL,MOTRIN) 600 MG tablet Take 1 tablet (600 mg total) by mouth every 8 (eight) hours as needed. (Patient taking differently: Take 600 mg by mouth every 8 (eight) hours as needed (for pain or headaches). ) 60 tablet 1  . lisinopril (PRINIVIL,ZESTRIL) 20 MG tablet Take 20 mg by mouth daily.    Marland Kitchen omeprazole (PRILOSEC) 20 MG capsule Take 20 mg by mouth daily as needed (for reflux symptoms).     . Oxycodone HCl 10 MG TABS Take 10 mg by mouth four times a day as needed for pain  0  . oxyCODONE-acetaminophen  (PERCOCET) 10-325 MG tablet Take 1 tablet by mouth every 8 (eight) hours as needed for pain. (Patient not taking: Reported on 02/08/2017) 30 tablet 0  . promethazine (PHENERGAN) 25 MG tablet Take 1 tablet (25 mg total) by mouth every 8 (eight) hours as needed for nausea or vomiting. 6 tablet 0  . sucralfate (CARAFATE) 1 G tablet Take 1 tablet (1 g total) by mouth 4 (four) times daily. (Patient not taking: Reported on 10/04/2016) 60 tablet 0   No current facility-administered medications on file prior to visit.    No Known Allergies  Objective:   General:  Alert and oriented x 3, in no acute distress  Dermatology: Skin is warm, dry, and supple bilateral. Nails are within normal limits. There is no lower extremity erythema, no eccymosis, + callus left foot, no open lesions present bilateral.   Vascular: Dorsalis Pedis and Posterior Tibial pedal pulses are 2/4 bilateral. + hair growth noted bilateral. Capillary Fill Time is 3 seconds in all digits. No varicosities, No edema bilateral lower extremities.   Neurological: Sensation grossly intact to light touch bilateral.   Musculoskeletal: There is decreased tenderness to palpation at the medial calcaneal tubercale and through the insertion of the plantar fascia on the Right foot. No pain with compression to calcaneus or application of tuning fork. There is decreased Ankle joint range  of motion bilateral. All other joints range of motion  within normal limits bilateral. Strength 5/5 bilateral.   Assessment and Plan: Problem List Items Addressed This Visit    None    Visit Diagnoses    Plantar fasciitis of right foot    -  Primary   Right foot pain          -Complete examination performed.  -Previous x-rays reviewed. -Discussed with patient in detail the condition of plantar fasciitis, how this  occurs related to the foot type of the patient and general treatment options. - Patient opted for another injection today; After oral consent and  aseptic prep, injected a mixture containing 1 ml of 1%plain lidocaine, 1 ml 0.5% plain marcaine, 0.5 ml of kenalog 10 and 0.5 ml of dexmethasone phosphate to right heel at area of most pain/trigger point injection. -Continue with stretching, icing, good supportive shoes, and heel lifts as dispensed today  -Patient to return to office if fails to continue to improve or sooner if problems or questions arise.  Landis Martins, DPM

## 2020-02-23 ENCOUNTER — Emergency Department (HOSPITAL_COMMUNITY): Payer: Medicaid Other

## 2020-02-23 ENCOUNTER — Emergency Department (HOSPITAL_COMMUNITY)
Admission: EM | Admit: 2020-02-23 | Discharge: 2020-02-23 | Disposition: A | Discharge status: Home / Self Care | Payer: Medicaid Other | Attending: Emergency Medicine | Admitting: Emergency Medicine

## 2020-02-23 ENCOUNTER — Encounter (HOSPITAL_COMMUNITY): Payer: Self-pay | Admitting: Emergency Medicine

## 2020-02-23 ENCOUNTER — Other Ambulatory Visit: Payer: Self-pay

## 2020-02-23 DIAGNOSIS — Z79899 Other long term (current) drug therapy: Secondary | ICD-10-CM | POA: Insufficient documentation

## 2020-02-23 DIAGNOSIS — Y939 Activity, unspecified: Secondary | ICD-10-CM | POA: Insufficient documentation

## 2020-02-23 DIAGNOSIS — M545 Low back pain: Secondary | ICD-10-CM | POA: Diagnosis not present

## 2020-02-23 DIAGNOSIS — S40021A Contusion of right upper arm, initial encounter: Secondary | ICD-10-CM | POA: Diagnosis not present

## 2020-02-23 DIAGNOSIS — R0781 Pleurodynia: Secondary | ICD-10-CM | POA: Insufficient documentation

## 2020-02-23 DIAGNOSIS — I10 Essential (primary) hypertension: Secondary | ICD-10-CM | POA: Diagnosis not present

## 2020-02-23 DIAGNOSIS — Y999 Unspecified external cause status: Secondary | ICD-10-CM | POA: Diagnosis not present

## 2020-02-23 DIAGNOSIS — S20412A Abrasion of left back wall of thorax, initial encounter: Secondary | ICD-10-CM | POA: Insufficient documentation

## 2020-02-23 DIAGNOSIS — Y929 Unspecified place or not applicable: Secondary | ICD-10-CM | POA: Diagnosis not present

## 2020-02-23 DIAGNOSIS — M542 Cervicalgia: Secondary | ICD-10-CM | POA: Insufficient documentation

## 2020-02-23 DIAGNOSIS — T07XXXA Unspecified multiple injuries, initial encounter: Secondary | ICD-10-CM

## 2020-02-23 DIAGNOSIS — T71193A Asphyxiation due to mechanical threat to breathing due to other causes, assault, initial encounter: Secondary | ICD-10-CM

## 2020-02-23 DIAGNOSIS — M7918 Myalgia, other site: Secondary | ICD-10-CM | POA: Diagnosis not present

## 2020-02-23 DIAGNOSIS — S20212A Contusion of left front wall of thorax, initial encounter: Secondary | ICD-10-CM

## 2020-02-23 DIAGNOSIS — Z7982 Long term (current) use of aspirin: Secondary | ICD-10-CM | POA: Diagnosis not present

## 2020-02-23 DIAGNOSIS — S40921A Unspecified superficial injury of right upper arm, initial encounter: Secondary | ICD-10-CM | POA: Diagnosis present

## 2020-02-23 LAB — BASIC METABOLIC PANEL
Anion gap: 7 (ref 5–15)
BUN: 12 mg/dL (ref 6–20)
CO2: 26 mmol/L (ref 22–32)
Calcium: 9 mg/dL (ref 8.9–10.3)
Chloride: 103 mmol/L (ref 98–111)
Creatinine, Ser: 0.61 mg/dL (ref 0.44–1.00)
GFR calc Af Amer: >60 mL/min (ref >60)
GFR calc non Af Amer: >60 mL/min (ref >60)
Glucose, Bld: 99 mg/dL (ref 70–99)
Potassium: 3.7 mmol/L (ref 3.5–5.1)
Sodium: 136 mmol/L (ref 135–145)

## 2020-02-23 LAB — CBC
HCT: 39.8 % (ref 36.0–46.0)
Hemoglobin: 12.9 g/dL (ref 12.0–15.0)
MCH: 27.8 pg (ref 26.0–34.0)
MCHC: 32.4 g/dL (ref 30.0–36.0)
MCV: 85.8 fL (ref 80.0–100.0)
Platelets: 348 10*3/uL (ref 150–400)
RBC: 4.64 MIL/uL (ref 3.87–5.11)
RDW: 13 % (ref 11.5–15.5)
WBC: 7.2 10*3/uL (ref 4.0–10.5)
nRBC: 0 % (ref 0.0–0.2)

## 2020-02-23 MED ORDER — CYCLOBENZAPRINE HCL 10 MG PO TABS: 5.0000 mg | ORAL_TABLET | Freq: Two times a day (BID) | ORAL | 0 refills | Status: AC | PRN

## 2020-02-23 MED ORDER — CYCLOBENZAPRINE HCL 10 MG PO TABS: 5.0000 mg | ORAL_TABLET | Freq: Two times a day (BID) | ORAL | 0 refills | Status: DC | PRN

## 2020-02-23 MED ORDER — ONDANSETRON HCL 4 MG/2ML IJ SOLN
4.0000 mg | Freq: Once | INTRAMUSCULAR | Status: AC
  Administered 2020-02-23: 14:00:00 4 mg via INTRAVENOUS
  Filled 2020-02-23: qty 2

## 2020-02-23 MED ORDER — FENTANYL CITRATE (PF) 100 MCG/2ML IJ SOLN
50.0000 ug | Freq: Once | INTRAMUSCULAR | Status: AC
  Administered 2020-02-23: 16:00:00 50 ug via INTRAVENOUS
  Filled 2020-02-23: qty 2

## 2020-02-23 MED ORDER — IOHEXOL 350 MG/ML SOLN
50.0000 mL | Freq: Once | INTRAVENOUS | Status: AC | PRN
  Administered 2020-02-23: 16:00:00 50 mL via INTRAVENOUS

## 2020-02-23 MED ORDER — FENTANYL CITRATE (PF) 100 MCG/2ML IJ SOLN
50.0000 ug | Freq: Once | INTRAMUSCULAR | Status: AC
  Administered 2020-02-23: 14:00:00 50 ug via INTRAVENOUS
  Filled 2020-02-23: qty 2

## 2020-02-23 MED ORDER — CELECOXIB 200 MG PO CAPS: 200.0000 mg | ORAL_CAPSULE | Freq: Two times a day (BID) | ORAL | 0 refills | Status: AC
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
## 2020-02-23 NOTE — ED Notes (Signed)
Patient verbalizes understanding of discharge instructions. Opportunity for questioning and answers were provided. Armband removed by staff, pt discharged from ED.  

## 2020-02-23 NOTE — Discharge Instructions (Signed)
Because you are currently taking Suboxone, I cannot prescribe narcotic pain medications. I have prescribed an strong NSAID for pain relief. I have also prescribed a muscle relaxer. You may take tylenol with the medication I have prescribed.   Contact a health care provider if: Your pain does not get better in 2-3 days. You develop increasing pain or difficulty with swallowing. Get help right away if: You suddenly have difficulty breathing. You have noisy breathing. You cough up blood. You cannot swallow. You develop a drooping face, sudden weakness on one side of your body, difficulty speaking, or difficulty understanding speech.

## 2020-04-01 ENCOUNTER — Other Ambulatory Visit: Payer: Self-pay

## 2020-04-01 ENCOUNTER — Other Ambulatory Visit: Payer: Self-pay | Admitting: Nurse Practitioner

## 2020-04-01 ENCOUNTER — Ambulatory Visit
Admission: RE | Admit: 2020-04-01 | Discharge: 2020-04-01 | Disposition: A | Discharge status: Home / Self Care | Payer: Medicaid Other | Source: Ambulatory Visit | Attending: Family Medicine | Admitting: Family Medicine

## 2020-04-01 DIAGNOSIS — Z1231 Encounter for screening mammogram for malignant neoplasm of breast: Secondary | ICD-10-CM
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
